# Patient Record
Sex: Male | Born: 1960 | Hispanic: No | Marital: Married | Smoking: Never smoker
Health system: Southern US, Community
[De-identification: ages and names within clinical notes are randomized; demographics above are authoritative.]

## PROBLEM LIST (undated history)

## (undated) DIAGNOSIS — J45909 Unspecified asthma, uncomplicated: Secondary | ICD-10-CM

---

## 2007-09-12 ENCOUNTER — Ambulatory Visit: Payer: Self-pay | Admitting: Family Medicine

## 2007-09-12 DIAGNOSIS — J4540 Moderate persistent asthma, uncomplicated: Secondary | ICD-10-CM | POA: Insufficient documentation

## 2007-09-12 DIAGNOSIS — B353 Tinea pedis: Secondary | ICD-10-CM

## 2007-09-12 DIAGNOSIS — J309 Allergic rhinitis, unspecified: Secondary | ICD-10-CM | POA: Insufficient documentation

## 2007-09-29 ENCOUNTER — Emergency Department (HOSPITAL_COMMUNITY): Admission: EM | Admit: 2007-09-29 | Discharge: 2007-09-29 | Payer: Self-pay | Admitting: Family Medicine

## 2007-09-29 ENCOUNTER — Telehealth: Payer: Self-pay | Admitting: *Deleted

## 2007-10-31 ENCOUNTER — Ambulatory Visit (HOSPITAL_COMMUNITY): Admission: RE | Admit: 2007-10-31 | Discharge: 2007-10-31 | Payer: Self-pay | Admitting: Family Medicine

## 2007-10-31 ENCOUNTER — Encounter: Payer: Self-pay | Admitting: Family Medicine

## 2007-10-31 ENCOUNTER — Ambulatory Visit: Payer: Self-pay | Admitting: Family Medicine

## 2007-10-31 DIAGNOSIS — M1710 Unilateral primary osteoarthritis, unspecified knee: Secondary | ICD-10-CM

## 2007-11-02 ENCOUNTER — Telehealth: Payer: Self-pay | Admitting: Family Medicine

## 2007-11-11 ENCOUNTER — Telehealth: Payer: Self-pay | Admitting: Family Medicine

## 2007-11-16 ENCOUNTER — Ambulatory Visit: Payer: Self-pay | Admitting: Sports Medicine

## 2007-12-08 ENCOUNTER — Ambulatory Visit: Payer: Self-pay | Admitting: Family Medicine

## 2007-12-29 ENCOUNTER — Telehealth: Payer: Self-pay | Admitting: *Deleted

## 2007-12-30 ENCOUNTER — Ambulatory Visit: Payer: Self-pay | Admitting: Family Medicine

## 2008-03-07 ENCOUNTER — Ambulatory Visit (HOSPITAL_COMMUNITY): Admission: RE | Admit: 2008-03-07 | Discharge: 2008-03-07 | Payer: Self-pay | Admitting: Family Medicine

## 2008-03-07 ENCOUNTER — Ambulatory Visit: Payer: Self-pay | Admitting: Family Medicine

## 2008-03-07 DIAGNOSIS — R05 Cough: Secondary | ICD-10-CM

## 2008-03-07 DIAGNOSIS — J069 Acute upper respiratory infection, unspecified: Secondary | ICD-10-CM | POA: Insufficient documentation

## 2008-04-10 ENCOUNTER — Ambulatory Visit: Payer: Self-pay | Admitting: Family Medicine

## 2008-04-20 ENCOUNTER — Ambulatory Visit: Payer: Self-pay

## 2008-05-21 ENCOUNTER — Ambulatory Visit: Payer: Self-pay | Admitting: Family Medicine

## 2008-06-01 ENCOUNTER — Ambulatory Visit: Payer: Self-pay | Admitting: Family Medicine

## 2008-06-22 ENCOUNTER — Ambulatory Visit: Payer: Self-pay | Admitting: Family Medicine

## 2008-10-19 ENCOUNTER — Ambulatory Visit: Payer: Self-pay | Admitting: Family Medicine

## 2009-01-08 ENCOUNTER — Encounter: Payer: Self-pay | Admitting: Family Medicine

## 2009-01-09 ENCOUNTER — Ambulatory Visit: Payer: Self-pay | Admitting: Family Medicine

## 2009-01-09 ENCOUNTER — Ambulatory Visit (HOSPITAL_COMMUNITY): Admission: RE | Admit: 2009-01-09 | Discharge: 2009-01-09 | Payer: Self-pay | Admitting: Family Medicine

## 2009-06-05 ENCOUNTER — Ambulatory Visit: Payer: Self-pay | Admitting: Family Medicine

## 2009-06-05 ENCOUNTER — Encounter: Payer: Self-pay | Admitting: Family Medicine

## 2009-11-29 ENCOUNTER — Ambulatory Visit: Payer: Self-pay | Admitting: Family Medicine

## 2009-12-06 ENCOUNTER — Ambulatory Visit: Payer: Self-pay | Admitting: Family Medicine

## 2009-12-16 ENCOUNTER — Ambulatory Visit: Payer: Self-pay | Admitting: Family Medicine

## 2009-12-16 ENCOUNTER — Encounter: Payer: Self-pay | Admitting: Family Medicine

## 2009-12-16 LAB — CONVERTED CEMR LAB
ALT: 31 units/L (ref 0–53)
AST: 26 units/L (ref 0–37)
Albumin: 4.1 g/dL (ref 3.5–5.2)
Alkaline Phosphatase: 60 units/L (ref 39–117)
Calcium: 9.1 mg/dL (ref 8.4–10.5)
Chloride: 102 meq/L (ref 96–112)
Cholesterol: 250 mg/dL — ABNORMAL HIGH (ref 0–200)
Creatinine, Ser: 0.87 mg/dL (ref 0.40–1.50)
HDL: 59 mg/dL (ref >39)
LDL Cholesterol: 147 mg/dL — ABNORMAL HIGH (ref 0–99)
Sodium: 140 meq/L (ref 135–145)
Total Bilirubin: 0.6 mg/dL (ref 0.3–1.2)
Triglycerides: 218 mg/dL — ABNORMAL HIGH (ref <150)
VLDL: 44 mg/dL — ABNORMAL HIGH (ref 0–40)

## 2009-12-20 ENCOUNTER — Ambulatory Visit: Payer: Self-pay | Admitting: Family Medicine

## 2009-12-20 DIAGNOSIS — R7301 Impaired fasting glucose: Secondary | ICD-10-CM | POA: Insufficient documentation

## 2009-12-20 DIAGNOSIS — E781 Pure hyperglyceridemia: Secondary | ICD-10-CM

## 2010-01-23 ENCOUNTER — Encounter: Payer: Self-pay | Admitting: *Deleted

## 2010-02-11 ENCOUNTER — Telehealth: Payer: Self-pay | Admitting: Family Medicine

## 2010-02-18 NOTE — Assessment & Plan Note (Signed)
Summary: Fu asthma/mj   Vital Signs:  Patient profile:   50 year old male Weight:      160.8 pounds Pulse rate:   72 / minute BP sitting:   112 / 70  (right arm)  Vitals Entered By: Arlyss Repress CMA, (12/31/2009 1:57 PM) CC: f/up prednisone. feels great. refill meds. Is Patient Diabetic? No Pain Assessment Patient in pain? no        Primary Care Provider:  Paula Compton MD  CC:  f/up prednisone. feels great. refill meds..  History of Present Illness: Visit conducted in [place].  Daughter is present with him today.   Feels well. The prednisone helped a lot.  No longer with wheezing or difficulty breathing.    Was out of Symbicort, thinks this was the trigger.  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  Is vigilant to avoid dust and pollen.  Nonsmoker, avoids secondhand smoke.   Went to [facility] and submitted new Rx for all his meds.  Is taking the omeprazole in case GERD is contributing.  Doesn't feel gerd sxs.  Habits & Providers  Alcohol-Tobacco-Diet     Tobacco Status: never  Allergies: No Known Drug Allergies  Family History: Father: DM Brother: stroke at age 44 Sister: unknown gynecologic problem Daughter: asthma Mother: passed away age 75 of unknown lung problem  31-Dec-2009: Father died 2 yrs ago of heart disease; also with DM controlled with meds (never on insulin)  Social History: Married Native of [place] in [place]. Wife from [place].  Lives with wife and daughter.  Came to [place] in 1986.  Moved to [place] from [place] in 1995.  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  Never smoked.  Drinks beer every 1-2 weeks.  Denies drug use. Exercises by riding a bike and playing soccer. Best contact # [phone]) cell  12/31/2009; Continues to work in store, deliveries.  Nonsmoker. Taken up bowling, cycling. Maintains activity.   Physical Exam  General:  Well-developed,well-nourished,in no acute distress;  alert,appropriate and cooperative throughout examination Eyes:  No corneal or conjunctival inflammation noted. EOMI. Perrla. Funduscopic exam benign, without hemorrhages, exudates or papilledema. Vision grossly normal. Ears:  External ear exam shows no significant lesions or deformities.  Otoscopic examination reveals clear canals, tympanic membranes are intact bilaterally without bulging, retraction, inflammation or discharge. Hearing is grossly normal bilaterally. Nose:  External nasal examination shows no deformity or inflammation. Nasal mucosa are pink and moist without lesions or exudates. Mouth:  Oral mucosa and oropharynx without lesions or exudates.  Teeth in good repair. Neck:  No deformities, masses, or tenderness noted. Lungs:  Normal respiratory effort, chest expands symmetrically. Lungs are clear to auscultation, no crackles or wheezes. Heart:  Normal rate and regular rhythm. S1 and S2 normal without gallop, murmur, click, rub or other extra sounds.   Impression & Recommendations:  Problem # 1:  ASTHMA, INTERMITTENT, MILD (ICD-493.90)  Resolved recent asthma flare.  Was out of his symbicort.  In reviewing his record with him, his last flares were 11/11, 05/2009, and 12/2008; before that it was almost 1 year without a flare.  Discussed use of Symbicort as a baseline med, which is what he does.  Also, flonase.   His updated medication list for this problem includes:    Ventolin Hfa 108 (90 Base) Mcg/act Aers (Albuterol sulfate) .Marland Kitchen... 2 puffs inhaled every 4 hours as needed for shortness of breath disp: one device  Symbicort 80-4.5 Mcg/act Aero (Budesonide-formoterol fumarate) ..... Sig: use 2 puffs two times a day  disp: 1 unit instructions in spanish  Orders: [facility]- Est  Level 4 ([address])  Problem # 2:  DEGENERATIVE JOINT DISEASE, RIGHT KNEE (ICD-715.96)  Discussed.  He does not have any knee pain anymore. Tries cycling and bowling.  To schedule for CPE, as he has not been screened  for DM in awhile, family members with DM (father) and heart disease.   His updated medication list for this problem includes:    Ibu 600 Mg Tabs (Ibuprofen) .Marland Kitchen... Take one tab three times a day as needed pain  Orders: [facility]- Est  Level 4 (99214)  Complete Medication List: 1)  Ventolin Hfa 108 (90 Base) Mcg/act Aers (Albuterol sulfate) .... 2 puffs inhaled every 4 hours as needed for shortness of breath disp: one device 2)  Ibu 600 Mg Tabs (Ibuprofen) .... Take one tab three times a day as needed pain 3)  Pocket Chamber Devi (Spacer/aero-holding chambers) .... Use with albuterol mdi 4)  Flonase 50 Mcg/act Susp (Fluticasone propionate) .... Sig: 2 sprays in each nostril once daily disp 1 unit instructions in spanish 5)  Symbicort 80-4.5 Mcg/act Aero (Budesonide-formoterol fumarate) .... Sig: use 2 puffs two times a day  disp: 1 unit instructions in spanish 6)  Robitussin Dm Sugar Free 100-10 Mg/14ml Syrp (Dextromethorphan-guaifenesin) .... Take 10 ml (2 teaspoons) every 4 hours for cough.  give one large bottle 7)  Fluticasone Propionate 50 Mcg/act Susp (Fluticasone propionate) .... 2 sprays in each nostril daily 8)  Prilosec 20 Mg Cpdr (Omeprazole) .Marland Kitchen.. 1 tablet daily  Other Orders: Future Orders: Lipid-[facility] ([id number]) ... 12/04/2010 Comp Met-[facility] ([id number]) ... 11/27/2010 CBC-[facility] (13086) ... 12/03/2010  Patient Instructions: 1)  Me alegro que esta' mejor desde punto de vista del asma.  2)  QUiero que se haga unas pruebas de la glucosa y el colesterol EN AYUNAS 8 HORAS en nuestra oficina, cuando pueda.  No requiere de una cita con el medico para [employer] pruebas de [place].  3)  Quiero verle despues de los examenes de sangre, para un chequeo fisico. 4)  MAKE CPE WITH DR Mauricio Po AFTER FASTING LABS (ORDERED IN LAB)   Orders Added: 1)  Lipid-[facility] [[id number]] 2)  Comp Met-[facility] [[id number]] 3)  CBC-[facility] [85027] 4)  [facility]- Est  Level 4 [28413]

## 2010-02-18 NOTE — Assessment & Plan Note (Signed)
 Summary: asthma   Vital Signs:  Patient Profile:   50 Years Old Male Height:     63.5 inches Weight:      167 pounds O2 Sat:      94 % O2 treatment:    Room Air Temp:     97.1 degrees F Pulse rate:   77 / minute BP sitting:   123 / 83  Vitals Entered ByBETHA HARLENE CARTE CMA (December 08, 2007 3:54 PM)                 Chief Complaint:  asthma.  History of Present Illness: 76M with history of nasal congestion, chest congestion, cough, inspiratory wheeze, itchy throat.  No fevers/chills, HA, swollen glands, CP, SOB, N/V/D, body aches.  Has had sick contact with similar symptoms.  Was given ventolin  inhaler previously but this doesn't help.  Has tried Dayquil which helped a little.  Right now the worst symptom is chest congestion.  No smoking.    Past Medical History:    Reviewed history from 09/12/2007 and no changes required:       allergies       TB infection treated in [place]  in 1989       No hospitalizations     Review of Systems       See HPI   Physical Exam  General:     Well-developed,well-nourished,in no acute distress; alert,appropriate and cooperative throughout examination Head:     Normocephalic and atraumatic without obvious abnormalities. Eyes:     No corneal or conjunctival inflammation noted. EOMI. Perrla.  Ears:     External ear exam shows no significant lesions or deformities.  Otoscopic examination reveals clear canals, tympanic membranes are intact bilaterally without bulging, retraction, inflammation or discharge. Hearing is grossly normal bilaterally. Nose:     External nasal examination shows no deformity or inflammation. Nasal mucosa are pink and moist without lesions or exudates. Mouth:     Oral mucosa and oropharynx without lesions or exudates.   Neck:     No deformities, masses, or tenderness noted. Lungs:     Normal respiratory effort, chest expands symmetrically. Lungs are clear to auscultation, no crackles.  Mild inspiratory  wheeze, inspiratory and expiratory phases equal. Heart:     Normal rate and regular rhythm. S1 and S2 normal without gallop, murmur, click, rub or other extra sounds. Cervical Nodes:     No lymphadenopathy noted    Impression & Recommendations:  Problem # 1:  URI (ICD-465.9) Likely acute URI.  Stable vitals and afebrile so unlikely influenza.  Will try mucinex  1200mg  two times a day to break up chest congestion and loratidine to decrease secretions.  Pt can return to clinic if symptoms haven't gotten better in a week.   His updated medication list for this problem includes:    Claritin  10 Mg Tabs (Loratadine ) .SABRA... 1 by mouth daily    Ibu 600 Mg Tabs (Ibuprofen) .SABRA... Take one tab three times a day as needed pain    Mucinex  Maximum Strength 1200 Mg Xr12h-tab (Guaifenesin ) ..... One tab by mouth two times a day    Claritin  10 Mg Tabs (Loratadine ) ..... One tab by mouth daily in the morning  Orders: [facility]- Est Level  3 (99213)   Complete Medication List: 1)  Ventolin  Hfa 108 (90 Base) Mcg/act Aers (Albuterol  sulfate) .... 2 puffs inhaled every 4 hours as needed for shortness of breath disp: one device 2)  Claritin  10 Mg Tabs (Loratadine ) .SABRASABRASABRA 1  by mouth daily 3)  Ibu 600 Mg Tabs (Ibuprofen) .... Take one tab three times a day as needed pain 4)  Pocket Chamber Devi (Spacer/aero-holding chambers) .... Use with albuterol  mdi 5)  Mucinex  Maximum Strength 1200 Mg Xr12h-tab (Guaifenesin ) .... One tab by mouth two times a day 6)  Claritin  10 Mg Tabs (Loratadine ) .... One tab by mouth daily in the morning  Other Orders: Pulse Oximetry- [facility] [phone])   Patient Instructions: 1)  1) Puede tomar Mucinex  (guaifenesin ) 1200mg  tabletas, tome una tableta por boca, cada 12 horas.  Esta medicina sirve para ablandar las secreciones respiratorias.  2)  2) Le estoy recetando loratadine  10mg  tabletas, tome una tableta por dia, en la manana.  3)  3) Regrese a la clinica si no mejora dentro de siete dias, o  con cualquier otro problema o pregunta.    Prescriptions: CLARITIN  10 MG TABS (LORATADINE ) One tab by mouth daily in the morning  #1 pack x 0   Entered and Authorized by:   DEBBY PETTIES MD   Signed by:   DEBBY PETTIES MD on 12/08/2007   Method used:   Print then Give to Patient   RxID:   [id number] MUCINEX  MAXIMUM STRENGTH 1200 MG XR12H-TAB (GUAIFENESIN ) One tab by mouth two times a day  #1 pack x 0   Entered and Authorized by:   DEBBY PETTIES MD   Signed by:   DEBBY PETTIES MD on 12/08/2007   Method used:   Print then Give to Patient   RxID:   [id number]  ]

## 2010-02-18 NOTE — Assessment & Plan Note (Signed)
Summary: CPE/EO   Vital Signs:  Patient profile:   50 year old male Height:      65 inches Weight:      168.06 pounds BMI:     28.07 BSA:     1.84 Temp:     98.0 degrees F Pulse rate:   71 / minute BP sitting:   115 / 72  Vitals Entered By: Jone Baseman CMA (December 20, 2009 8:44 AM) CC: cpe Is Patient Diabetic? No Pain Assessment Patient in pain? no        Primary Care Provider:  Paula Compton MD  CC:  cpe.  History of Present Illness: Visit conducted in [place].  Jon Bowen comes in today to review his recent fasting labs.  Signficant for elevated fasting glucose (105), as well as elevated TGs and LDL cholesterol, with HDL of 59.  Jon Bowen has had modest weight gain since becoming less active; he used to play soccer regularly, now not so much.  Has had a goal to become active in cycling, however not doing so now.   We reviewed diet issues, Jon Bowen eats 4 or more tortillas  daily (flour or corn), as well as biscuits when on the road during the day.  Does drink soda.    No family members with known heart disease.    Jon Bowen never smoked; describes his alcohol intake as sporadic (at parties couple of times a year).  No drug use history.  Works in deliveries.   Denies chest pains; has recovered from recent asthma attack.    Habits & Providers  Alcohol-Tobacco-Diet     Tobacco Status: never  Current Medications (verified): 1)  Ventolin Hfa 108 (90 Base) Mcg/act Aers (Albuterol Sulfate) .... 2 Puffs Inhaled Every 4 Hours As Needed For Shortness of Breath Disp: One Device 2)  Ibu 600 Mg Tabs (Ibuprofen) .... Take One Tab Three Times A Day As Needed Pain 3)  Pocket Chamber  Devi (Spacer/aero-Holding Cashion) .... Use With Albuterol Mdi 4)  Flonase 50 Mcg/act Susp (Fluticasone Propionate) .... Sig: 2 Sprays in Each Nostril Once Daily Disp 1 Unit Instructions in Spanish 5)  Symbicort 80-4.5 Mcg/act Aero (Budesonide-Formoterol Fumarate) .... Sig: Use 2 Puffs Two Times A Day   Disp: 1 Unit Instructions in Spanish 6)  Robitussin Dm Sugar Free 100-10 Mg/79ml Syrp (Dextromethorphan-Guaifenesin) .... Take 10 Ml (2 Teaspoons) Every 4 Hours For Cough.  Give One Large Bottle 7)  Fluticasone Propionate 50 Mcg/act Susp (Fluticasone Propionate) .... 2 Sprays in Each Nostril Daily 8)  Prilosec 20 Mg Cpdr (Omeprazole) .Marland Kitchen.. 1 Tablet Daily  Allergies (verified): No Known Drug Allergies  Family History: Reviewed history from 12/20/2009 and no changes required. Father: DM Brother: stroke at age 93 Sister: unknown gynecologic problem Daughter: asthma Mother: passed away age 64 of unknown lung problem  2009-12-20: Father died 2 yrs ago of heart disease; also with DM controlled with meds (never on insulin)  Social History: Reviewed history from 12/20/09 and no changes required. Married [place] in [place]. Wife from [place].  Lives with wife and daughter.  Came to [place] in 1986.  Moved to [place] from [place] in 1995.  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  Never smoked.  Drinks beer every 1-2 weeks.  Denies drug use. Exercises by riding a bike and playing soccer. Best contact # [phone]) cell  12/20/09; Continues to work in store, deliveries.  Nonsmoker. Taken up bowling, cycling. Maintains activity.   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 12/31/2010 Comp Met-[facility] ([id number]) ... 01/01/2011 TSH-[facility] [phone]) ... 12/31/2010  Problem # 2:  IMPAIRED FASTING GLUCOSE (ICD-790.21)  To recheck fasting glucose along with lipid panel after diet and exercise modification.  Discussed criteria for DM diagnosis, use of lifestyle modification to reduce this risk.  Jon Bowen is a good candidate for lifestyle modification, appears to have good motivation.  Orders: [facility]- Est   Level 4 (99214)  Complete Medication List: 1)  Ventolin Hfa 108 (90 Base) Mcg/act Aers (Albuterol sulfate) .... 2 puffs inhaled every 4 hours as needed for shortness of breath disp: one device 2)  Ibu 600 Mg Tabs (Ibuprofen) .... Take one tab three times a day as needed pain 3)  Pocket Chamber Devi (Spacer/aero-holding chambers) .... Use with albuterol mdi 4)  Flonase 50 Mcg/act Susp (Fluticasone propionate) .... Sig: 2 sprays in each nostril once daily disp 1 unit instructions in spanish 5)  Symbicort 80-4.5 Mcg/act Aero (Budesonide-formoterol fumarate) .... Sig: use 2 puffs two times a day  disp: 1 unit instructions in spanish 6)  Robitussin Dm Sugar Free 100-10 Mg/14ml Syrp (Dextromethorphan-guaifenesin) .... Take 10 ml (2 teaspoons) every 4 hours for cough.  give one large bottle 7)  Fluticasone Propionate 50 Mcg/act Susp (Fluticasone propionate) .... 2 sprays in each nostril daily 8)  Prilosec 20 Mg Cpdr (Omeprazole) .Marland Kitchen.. 1 tablet daily  Patient Instructions: 1)  Fue un placer verle hoy.  Como hablamos, quiero que se mejore el perfil de salud siguiendo el siguiente plan:  2)  1) aumentar actividad, como estaba pensando en hacer (ciclismo, correr, lo que Investment banker, operational) 3)  2) Limitar el consumo de comidas con mucho almidon/carbohidrato (pan, arroz blanco, tortilla, bebidas con azucar como la coca-cola, jugos, dulces).  La mitad del plato de comida se debe cubrir de vegetales o granos (fibra), un cuarto de carne/pescado bajo de grasa, y un cuarto de carbohidratos. 4)  3) Aumentar el consumo de [place].  5)  Tambien, puede comprar una preparacion de [place] como Metamucil (u otro similar mas barato que contiene "PSYLLIUM") Mezclar con agua y tomar una vez al dia.  Ayuda tambien con el estrenimiento. 6)  Quiero volver a chequear el perfil de colesterol en 4 a 6 meses, en ayunas. Prescriptions: SYMBICORT 80-4.5 MCG/ACT AERO (BUDESONIDE-FORMOTEROL FUMARATE) SIG: Use 2 puffs two times a day  DISP: 1  unit Instructions in Spanish  #1 x 12   Entered and Authorized by:   Paula Compton MD   Signed by:   Paula Compton MD on 12/20/2009   Method used:   Faxed to ...       [facility] Department (retail)       [address], [place]  [address]       Ph: [phone]       Fax: [phone]   RxID:   [id number] FLONASE 50 MCG/ACT SUSP (FLUTICASONE PROPIONATE) SIG: 2 sprays in each nostril once daily DISP 1 unit Instructions in Spanish  #1 x 6   Entered and Authorized by:   Paula Compton MD   Signed by:   Paula Compton MD on 12/20/2009   Method used:   Faxed to ...       [facility] Department (retail)       [address]  [place], [place]  [address]       Ph: [phone]       Fax: [phone]   RxID:   [id number] VENTOLIN HFA 108 (90 BASE) MCG/ACT AERS (ALBUTEROL SULFATE) 2 puffs inhaled every 4 hours as needed for shortness of breath disp: one device  #3 x 3   Entered and Authorized by:   Paula Compton MD   Signed by:   Paula Compton MD on 12/20/2009   Method used:   Faxed to ...       [facility] Department (retail)       [address] [place], [place]  [address]       Ph: [phone]       Fax: [phone]   RxID:   [id number]    Orders Added: 1)  Lipid-[facility] [[id number]] 2)  Comp Met-[facility] [[id number]] 3)  TSH-[facility] [[id number]] 4)  [facility]- Est  Level 4 [57322]

## 2010-02-18 NOTE — Assessment & Plan Note (Signed)
Summary: Sinus congestion and wheezing x 2 weeks/kf   Vital Signs:  Patient profile:   50 year old male Height:      65 inches Weight:      164 pounds BMI:     27.39 Temp:     97.8 degrees F oral Pulse rate:   66 / minute BP sitting:   115 / 74  (left arm) Cuff size:   regular  Vitals Entered By: Tessie Fass CMA (November 29, 2009 9:19 AM) CC: cough and congestion x 2 weeks   Primary Care Provider:  Paula Compton MD  CC:  cough and congestion x 2 weeks.  History of Present Illness: 28 YOM w/ PMHx/o mild persistent asthma with 2 week hx/o increased WOB, wheezing, and cough. Pt reprots being off maintenance inhaler (symbicort) for greater than 1 month. Has noticed dyspnea, cough and wheezng for last 2 weeks. Most prominent at night. No fever per pt. + Nasal congestion and intermittent rhinorrhea. No decongestant/ antihistamine use. Cough non-productive. Has used albuterol mainly at night. Dyspnea, wheeze min-moderately relieved with albuterol. Sxs most prominent at night, though sxs sometimes aggravated at work. No CP. Intermittnet reflux type xsx per pt. Does report greasy/spicy diet. Pt does report working a in Scientist, research (life sciences).    Allergies: No Known Drug Allergies  Physical Exam  General:  alert, in min distress Head:  NCAT Nose:  + nasal erythema/swelling bilaterally Mouth:  good dentition and pharynx pink and moist.   Neck:  supple and full ROM, no LAD Lungs:  faint end expiratory wheeze, most prominent in apices bilaterally  Heart:  RRR, no rubs, gallops, murmurs  Abdomen:  S/NT/+bowel sounds    Impression & Recommendations:  Problem # 1:  ASTHMA, INTERMITTENT, MILD (ICD-493.90) Pt w/ likely astham flare in setting of being off maintenance inhaler (symbicort). Plan to place pt on by mouth steroids for 5 day course. WIll also restart symbicort. Plan to also place pt on prilosec for gi prophylaxis if ther is a component og reflux contributing to sxs. Will also  start nasal steroid in setting of baseline allergic rhinitits. Overall history and presentation reassuring. Currently no clinical indication for CXR. However, respiratory and infectious red flags reviewed extensively.  Pt agreeable to overall plan.  Pt instructed to followup w/ PCP in 1 week.  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 Counseled pt on need of constant respirator use, as likely pt chronically exposed to multiple respiratory irritants in workplace. His updated medication list for this problem includes:    Ventolin Hfa 108 (90 Base) Mcg/act Aers (Albuterol sulfate) .Marland Kitchen... 2 puffs inhaled every 4 hours as needed for shortness of breath disp: one device    Symbicort 80-4.5 Mcg/act Aero (Budesonide-formoterol fumarate) ..... Sig: use 2 puffs two times a day  disp: 1 unit instructions in spanish    Prednisone 20 Mg Tabs (Prednisone) .Marland KitchenMarland KitchenMarland KitchenMarland Kitchen 3 tablets daily for 5 days  Orders: [facility]- Est Level  3 (04540)  Problem # 2:  ALLERGIC RHINITIS (ICD-477.9) Plan to start/restart pt on nasal steroid given nasal congestion, and baseline hx/o allergic rhinitis.  His updated medication list for this problem includes:    Flonase 50 Mcg/act Susp (Fluticasone propionate) ..... Sig: 2 sprays in each nostril once daily disp 1 unit instructions in spanish    Fluticasone Propionate 50 Mcg/act Susp (Fluticasone propionate) .Marland Kitchen... 2 sprays in each nostril daily  Orders: [facility]- Est Level  3 (98119)  Complete Medication List: 1)  Ventolin Hfa 108 (90  Base) Mcg/act Aers (Albuterol sulfate) .... 2 puffs inhaled every 4 hours as needed for shortness of breath disp: one device 2)  Ibu 600 Mg Tabs (Ibuprofen) .... Take one tab three times a day as needed pain 3)  Pocket Chamber Devi (Spacer/aero-holding chambers) .... Use with albuterol mdi 4)  Flonase 50 Mcg/act Susp (Fluticasone propionate) .... Sig: 2 sprays in each nostril once daily disp 1 unit instructions in spanish 5)  Symbicort 80-4.5 Mcg/act Aero  (Budesonide-formoterol fumarate) .... Sig: use 2 puffs two times a day  disp: 1 unit instructions in spanish 6)  Robitussin Dm Sugar Free 100-10 Mg/16ml Syrp (Dextromethorphan-guaifenesin) .... Take 10 ml (2 teaspoons) every 4 hours for cough.  give one large bottle 7)  Prednisone 20 Mg Tabs (Prednisone) .... 3 tablets daily for 5 days 8)  Fluticasone Propionate 50 Mcg/act Susp (Fluticasone propionate) .... 2 sprays in each nostril daily 9)  Prilosec 20 Mg Cpdr (Omeprazole) .Marland Kitchen.. 1 tablet daily  Other Orders: Influenza Vaccine NON MCR (16109)  Patient Instructions: 1)  It was good to meet you today 2)  You are likely having an asthma exacerbation 3)  I will be prescribing you some steroids to help with your breathing 4)  I will also refill your symbicort: use it as prescribed daily 5)  I will give you a prescription for flonase to help with your nasal symptoms 6)  I will also prescribe some prilosec to make sure there isnt a component of heartburn causing your symptoms 7)  If you develop any fever, worsening breathing, productive cough, or any other concerning symptoms please give [place] a call, 8)  Followup with Dr. Mauricio Po next week.  Prescriptions: PRILOSEC 20 MG CPDR (OMEPRAZOLE) 1 tablet daily  #30 x 6   Entered and Authorized by:   Doree Albee MD   Signed by:   Doree Albee MD on 11/29/2009   Method used:   Print then Give to Patient   RxID:   [id number] SYMBICORT 80-4.5 MCG/ACT AERO (BUDESONIDE-FORMOTEROL FUMARATE) SIG: Use 2 puffs two times a day  DISP: 1 unit Instructions in Spanish  #1 x 12   Entered and Authorized by:   Doree Albee MD   Signed by:   Doree Albee MD on 11/29/2009   Method used:   Print then Give to Patient   RxID:   [id number] VENTOLIN HFA 108 (90 BASE) MCG/ACT AERS (ALBUTEROL SULFATE) 2 puffs inhaled every 4 hours as needed for shortness of breath disp: one device  #1 x 3   Entered and Authorized by:   Doree Albee MD   Signed by:   Doree Albee  MD on 11/29/2009   Method used:   Print then Give to Patient   RxID:   [id number] FLUTICASONE PROPIONATE 50 MCG/ACT SUSP (FLUTICASONE PROPIONATE) 2 sprays in each nostril daily  #1 bottle x 6   Entered and Authorized by:   Doree Albee MD   Signed by:   Doree Albee MD on 11/29/2009   Method used:   Print then Give to Patient   RxID:   [id number] PREDNISONE 20 MG TABS (PREDNISONE) 3 tablets daily for 5 days  #15 x 0   Entered and Authorized by:   Doree Albee MD   Signed by:   Doree Albee MD on 11/29/2009   Method used:   Print then Give to Patient   RxID:   [id number]    Orders Added: 1)  Influenza Vaccine NON MCR [00028] 2)  [facility]- Est Level  3 [04540]   Immunizations Administered:  Influenza Vaccine # 1:    Vaccine Type: Fluvax Non-MCR    Site: left deltoid    Mfr: GlaxoSmithKline    Dose: 0.5 ml    Given by: Tessie Fass CMA    Exp. Date: 07/19/2010    Lot #: [id number]    VIS given: 08/13/09 version given November 29, 2009.  Flu Vaccine Consent Questions:    Do you have a history of severe allergic reactions to this vaccine? no    Any prior history of allergic reactions to egg and/or gelatin? no    Do you have a sensitivity to the preservative Thimersol? no    Do you have a past history of Guillan-Barre Syndrome? no    Do you currently have an acute febrile illness? no    Have you ever had a severe reaction to latex? no    Vaccine information given and explained to patient? yes   Immunizations Administered:  Influenza Vaccine # 1:    Vaccine Type: Fluvax Non-MCR    Site: left deltoid    Mfr: GlaxoSmithKline    Dose: 0.5 ml    Given by: Tessie Fass CMA    Exp. Date: 07/19/2010    Lot #: [id number]    VIS given: 08/13/09 version given November 29, 2009.

## 2010-02-18 NOTE — Miscellaneous (Signed)
Summary: APPT made   Clinical Lists Changes appt lm cough x 10 days.lm back be here at 3:30.Golden Circle RN  Jun 05, 2009 2:37 PM

## 2010-02-18 NOTE — Assessment & Plan Note (Signed)
Summary: URI (cough)/wheezing   Vital Signs:  Patient profile:   50 year old male Weight:      162.7 pounds O2 Sat:      92 % on Room air Temp:     97.5 degrees F oral Pulse rate:   74 / minute Pulse rhythm:   regular BP sitting:   114 / 81  (left arm) Cuff size:   regular  Vitals Entered By: Loralee Pacas CMA (Jun 05, 2009 3:31 PM)  O2 Flow:  Room air  Serial Vital Signs/Assessments:  Comments: 4:50 PM pulse ox pre neb tx 92% on room air, post neb tx 100% By: Gladstone Pih   CC: cough, wheezing Comments pt coughing up green phlem, has had cough x 10 days   Primary Care Provider:  Paula Compton MD  CC:  cough and wheezing.  History of Present Illness: Cough: Pt has had a cough for the last 10 dys. He says it is worse at night, but is still there during the day. He has some congestion. He has no sick contcts, he is having some sore throat. He has not had any fevers or chills. He feels fine other than his cough and congestion. He has not tried anything yet.    Wheezing: Pt is also having some wheezing. He says that he has albuterol at home but is not using it. He is using his Symbicort daily. He says he can feel and hear the wheezing in his chest.   Current Medications (verified): 1)  Ventolin Hfa 108 (90 Base) Mcg/act Aers (Albuterol Sulfate) .... 2 Puffs Inhaled Every 4 Hours As Needed For Shortness of Breath Disp: One Device 2)  Ibu 600 Mg Tabs (Ibuprofen) .... Take One Tab Three Times A Day As Needed Pain 3)  Pocket Chamber  Devi (Spacer/aero-Holding [place]) .... Use With Albuterol Mdi 4)  Flonase 50 Mcg/act Susp (Fluticasone Propionate) .... Sig: 2 Sprays in Each Nostril Once Daily Disp 1 Unit Instructions in Spanish 5)  Symbicort 80-4.5 Mcg/act Aero (Budesonide-Formoterol Fumarate) .... Sig: Use 2 Puffs Two Times A Day  Disp: 1 Unit Instructions in Spanish 6)  Robitussin Dm Sugar Free 100-10 Mg/13ml Syrp (Dextromethorphan-Guaifenesin) .... Take 10 Ml (2 Teaspoons)  Every 4 Hours For Cough.  Give One Large Bottle  Allergies (verified): No Known Drug Allergies  Review of Systems        vitals reviewed and pertinent negatives and positives seen in HPI   Physical Exam  General:  Well-developed,well-nourished,in no acute distress; alert,appropriate and cooperative throughout examination Eyes:  No corneal or conjunctival inflammation noted. EOMI. Perrla. Funduscopic exam benign, without hemorrhages, exudates or papilledema. Vision grossly normal. Ears:  External ear exam shows no significant lesions or deformities.  Otoscopic examination reveals clear canals, tympanic membranes are intact bilaterally without bulging, retraction, inflammation or discharge. Hearing is grossly normal bilaterally. Nose:  External nasal examination shows no deformity or inflammation. Nasal mucosa are pink and moist without lesions or exudates. Mouth:  Oral mucosa and oropharynx without lesions or exudates.  Teeth in good repair. Lungs:  bilateral wheezing and rhonchi throughout lung fields during initial exam, wheezing cleared after albuterol/atrovent treatent but rhonchi remained.  Heart:  Normal rate and regular rhythm. S1 and S2 normal without gallop, murmur, click, rub or other extra sounds.   Impression & Recommendations:  Problem # 1:  UPPER RESPIRATORY INFECTION, ACUTE (ICD-465.9) Assessment New Pt likely has viral URI. He has had cough x 10 days. He is afebrile, his only  concern is the cough he otherwise feels well. Plan to treat with Robitussin DM.   The following medications were removed from the medication list:    Cetirizine Hcl 10 Mg Tabs (Cetirizine hcl) .Marland Kitchen... 1 by mouth daily for allergies    Tessalon Perles 100 Mg Caps (Benzonatate) .Marland Kitchen... 1-2 tablets every 8 hours as needed for cough His updated medication list for this problem includes:    Ibu 600 Mg Tabs (Ibuprofen) .Marland Kitchen... Take one tab three times a day as needed pain    Robitussin Dm Sugar Free 100-10  Mg/71ml Syrp (Dextromethorphan-guaifenesin) .Marland Kitchen... Take 10 ml (2 teaspoons) every 4 hours for cough.  give one large bottle  Orders: [facility]- Est  Level 4 (99214)  Problem # 2:  ASTHMA, INTERMITTENT, MILD (ICD-493.90) Assessment: New Pt is having some asthma flare up. After his albuterol/atrovent neb in the office he did not have any more wheezing. Plan to treat with albuterol q 4 hours for the next 2 days as needed for wheezing. Cont Symbicort.   His updated medication list for this problem includes:    Ventolin Hfa 108 (90 Base) Mcg/act Aers (Albuterol sulfate) .Marland Kitchen... 2 puffs inhaled every 4 hours as needed for shortness of breath disp: one device    Symbicort 80-4.5 Mcg/act Aero (Budesonide-formoterol fumarate) ..... Sig: use 2 puffs two times a day  disp: 1 unit instructions in spanish  Orders: Nebulizer Tx (25956) Ipratropium inhalation sol. unit dose (L8756) Albuterol Sulfate Sol 1mg  unit dose (E3329) Pulse Oximetry- [facility] (94760) [facility]- Est  Level 4 (99214)  Complete Medication List: 1)  Ventolin Hfa 108 (90 Base) Mcg/act Aers (Albuterol sulfate) .... 2 puffs inhaled every 4 hours as needed for shortness of breath disp: one device 2)  Ibu 600 Mg Tabs (Ibuprofen) .... Take one tab three times a day as needed pain 3)  Pocket Chamber Devi (Spacer/aero-holding chambers) .... Use with albuterol mdi 4)  Flonase 50 Mcg/act Susp (Fluticasone propionate) .... Sig: 2 sprays in each nostril once daily disp 1 unit instructions in spanish 5)  Symbicort 80-4.5 Mcg/act Aero (Budesonide-formoterol fumarate) .... Sig: use 2 puffs two times a day  disp: 1 unit instructions in spanish 6)  Robitussin Dm Sugar Free 100-10 Mg/60ml Syrp (Dextromethorphan-guaifenesin) .... Take 10 ml (2 teaspoons) every 4 hours for cough.  give one large bottle  Patient Instructions: 1)  Use the Albuterol inhaler every 4 hours for the next 2 days.  2)  Take the Robitussin DM medicine for the cough.  3)  If you start to have fevers  or chills or worsening shortness of breath call our office to be seen.  4)  Follow up with Dr. Mauricio Po as needed or if your cough is not improved by next week.  Prescriptions: ROBITUSSIN DM SUGAR FREE 100-10 MG/5ML SYRP (DEXTROMETHORPHAN-GUAIFENESIN) take 10 ml (2 teaspoons) every 4 hours for cough.  Give one large bottle  #1 x 1   Entered and Authorized by:   Jamie Brookes MD   Signed by:   Jamie Brookes MD on 06/05/2009   Method used:   Print then Give to Patient   RxID:   [id number]    Medication Administration  Medication # 1:    Medication: Albuterol Sulfate Sol 1mg  unit dose    Diagnosis: ASTHMA, INTERMITTENT, MILD (ICD-493.90)    Dose: 2.5/35ml    Route: inhaled    Exp Date: 12/2010    Lot #: [id number]    Mfr: nephron    Patient tolerated medication without  complications    Given by: Gladstone Pih (Jun 05, 2009 4:16 PM)  Medication # 2:    Medication: Ipratropium inhalation sol. unit dose    Diagnosis: ASTHMA, INTERMITTENT, MILD (ICD-493.90)    Dose: 0.5mg Dellia Nims    Route: inhaled    Exp Date: 09/2010    Lot #: [id number]    Mfr: nephron    Patient tolerated medication without complications    Given by: Gladstone Pih (Jun 05, 2009 4:18 PM)  Orders Added: 1)  Nebulizer Tx [phone] 2)  Ipratropium inhalation sol. unit dose [J7644] 3)  Albuterol Sulfate Sol 1mg  unit dose [J7613] 4)  Pulse Oximetry- [facility] [94760] 5)  [facility]- Est  Level 4 [70350]

## 2010-02-20 NOTE — Miscellaneous (Signed)
Summary:  med list   Clinical Lists Changes  patient calls because he was unable to get meds from [facility] because they need an updated copy of his med list. calld [facility] and verified this is what they need. med list faxed. Theresia Lo RN  January 23, 2010 10:09 AM

## 2010-02-20 NOTE — Progress Notes (Signed)
Summary: rx  Phone Note Refill Request Call back at Home Phone [phone]   Refills Requested: Medication #1:  VENTOLIN HFA 108 (90 BASE) MCG/ACT AERS 2 puffs inhaled every 4 hours as needed for shortness of breath disp: one device  Medication #2:  FLONASE 50 MCG/ACT SUSP SIG: 2 sprays in each nostril once daily DISP 1 unit Instructions in Spanish  Medication #3:  SYMBICORT 80-4.5 MCG/ACT AERO SIG: Use 2 puffs two times a day  DISP: 1 unit Instructions in Spanish pt needs rx at [facility]   Initial call taken by: Ferne Coe,  February 11, 2010 10:48 AM    Prescriptions: SYMBICORT 80-4.5 MCG/ACT AERO (BUDESONIDE-FORMOTEROL FUMARATE) SIG: Use 2 puffs two times a day  DISP: 1 unit Instructions in Spanish  #1 x 12   Entered and Authorized by:   Paula Compton MD   Signed by:   Paula Compton MD on 02/11/2010   Method used:   Faxed to ...       [facility] Department (retail)       [address] [place], [place]  [address]       Ph: [phone]       Fax: [phone]   RxID:   [id number] FLONASE 50 MCG/ACT SUSP (FLUTICASONE PROPIONATE) SIG: 2 sprays in each nostril once daily DISP 1 unit Instructions in Spanish  #1 x 6   Entered and Authorized by:   Paula Compton MD   Signed by:   Paula Compton MD on 02/11/2010   Method used:   Faxed to ...       [facility] Department (retail)       [address] [place], [place]  [address]       Ph: [phone]       Fax: [phone]   RxID:   [id number] VENTOLIN HFA 108 (90 BASE) MCG/ACT AERS (ALBUTEROL SULFATE) 2 puffs inhaled every 4 hours as needed for shortness of breath disp: one device  #3 x 3   Entered and Authorized by:   Paula Compton MD   Signed by:   Paula Compton MD on 02/11/2010   Method used:   Faxed to ...       [facility] Department (retail)       [address] [place], [place]  [address]       Ph: [phone]       Fax: [phone]   RxID:    [id number]

## 2010-03-31 ENCOUNTER — Encounter: Payer: Self-pay | Admitting: Family Medicine

## 2010-03-31 ENCOUNTER — Ambulatory Visit (INDEPENDENT_AMBULATORY_CARE_PROVIDER_SITE_OTHER): Payer: Self-pay | Admitting: Family Medicine

## 2010-03-31 VITALS — BP 138/88 | HR 74 | Temp 98.7°F | Ht 65.0 in | Wt 167.0 lb

## 2010-03-31 DIAGNOSIS — J45998 Other asthma: Secondary | ICD-10-CM

## 2010-03-31 DIAGNOSIS — K219 Gastro-esophageal reflux disease without esophagitis: Secondary | ICD-10-CM

## 2010-03-31 DIAGNOSIS — J45909 Unspecified asthma, uncomplicated: Secondary | ICD-10-CM

## 2010-03-31 DIAGNOSIS — J309 Allergic rhinitis, unspecified: Secondary | ICD-10-CM

## 2010-03-31 DIAGNOSIS — H109 Unspecified conjunctivitis: Secondary | ICD-10-CM | POA: Insufficient documentation

## 2010-03-31 MED ORDER — BACITRACIN-POLYMYXIN B 500-10000 UNIT/GM OP OINT
TOPICAL_OINTMENT | Freq: Two times a day (BID) | OPHTHALMIC | Status: DC
Start: 2010-03-31 — End: 2010-03-31

## 2010-03-31 MED ORDER — BUDESONIDE-FORMOTEROL FUMARATE 80-4.5 MCG/ACT IN AERO: 2.0000 | INHALATION_SPRAY | Freq: Two times a day (BID) | RESPIRATORY_TRACT | Status: DC

## 2010-03-31 MED ORDER — CETIRIZINE HCL 10 MG PO TABS: 10.0000 mg | ORAL_TABLET | Freq: Every day | ORAL | Status: DC

## 2010-03-31 MED ORDER — BACITRACIN-POLYMYXIN B 500-10000 UNIT/GM OP OINT: TOPICAL_OINTMENT | Freq: Two times a day (BID) | OPHTHALMIC | Status: AC

## 2010-03-31 MED ORDER — FLUTICASONE PROPIONATE 50 MCG/ACT NA SUSP: 2.0000 | Freq: Every day | NASAL | Status: DC

## 2010-03-31 MED ORDER — OMEPRAZOLE MAGNESIUM 20 MG PO TBEC: 20.0000 mg | DELAYED_RELEASE_TABLET | Freq: Every day | ORAL | Status: DC

## 2010-03-31 MED ORDER — ALBUTEROL SULFATE HFA 108 (90 BASE) MCG/ACT IN AERS: 2.0000 | INHALATION_SPRAY | RESPIRATORY_TRACT | Status: DC | PRN

## 2010-03-31 NOTE — Progress Notes (Signed)
  Subjective:    Patient ID: Jon Bowen, male    DOB: [birth year], 50 y.o.   MRN: [id number]  HPIThree weeks of eye irritation, first the right then both, with itching and clear drainage. Feels like something in it and slightly blurry at times, but no pus seen. No ill contacts. No sneezing. Allergic rhinitis is well controlled with Flonase and asthma with Symbicort. Uses the Albuterol MDI less than once weekly without a spacer. He is taking otc Omeprazole for heartburn.     Review of Systems     Objective:   Physical Exam  Constitutional: He appears well-developed and well-nourished.  HENT:  Right Ear: External ear normal.  Left Ear: External ear normal.  Nose: Rhinorrhea present. No mucosal edema.  Eyes: Pupils are equal, round, and reactive to light. Right eye exhibits discharge. Right eye exhibits no chemosis and no exudate. Left eye exhibits discharge. Left eye exhibits no chemosis and no exudate. Right conjunctiva is injected. Left conjunctiva is injected.       No purulence. Fluorescein staining negative for abrasions Fundi normal  Cardiovascular: Normal rate and regular rhythm.   Pulmonary/Chest: Effort normal and breath sounds normal. No stridor. He has no wheezes. He has no rales.  Lymphadenopathy:    He has no cervical adenopathy.          Assessment & Plan:

## 2010-03-31 NOTE — Assessment & Plan Note (Signed)
asymtomatic since treated in [facility].

## 2010-03-31 NOTE — Patient Instructions (Signed)
Su diagnostico es conjunctivitis. Para comezon y alergia [place] Zyrtec. Para posibilidad de infection, unguento de polymyxin/bacitracin dos veces al dia.  Recheck if not improved in 5 days.

## 2010-03-31 NOTE — Assessment & Plan Note (Signed)
Controlled with Flonase 

## 2010-07-21 ENCOUNTER — Ambulatory Visit (INDEPENDENT_AMBULATORY_CARE_PROVIDER_SITE_OTHER): Payer: Self-pay | Admitting: Family Medicine

## 2010-07-21 ENCOUNTER — Encounter: Payer: Self-pay | Admitting: Family Medicine

## 2010-07-21 DIAGNOSIS — R05 Cough: Secondary | ICD-10-CM

## 2010-07-21 DIAGNOSIS — S76319A Strain of muscle, fascia and tendon of the posterior muscle group at thigh level, unspecified thigh, initial encounter: Secondary | ICD-10-CM

## 2010-07-21 DIAGNOSIS — IMO0002 Reserved for concepts with insufficient information to code with codable children: Secondary | ICD-10-CM

## 2010-07-21 NOTE — Patient Instructions (Addendum)
I would like for you to use your flonase everyday for the next two weeks to see if this improves your cough.  Also you can try an over the counter cough syrup such as robitussin or delsym to help with your cough, especially if it is keeping you up at night.  If your cough continues please let us know.  For your legs be sure to stretch out before and after playing soccer.  You can also use over the counter ibuprofen for the pain, 400-600mg  every 6 hours as needed for pain.  Also try to stay well hydrated while playing and after you finish playing continue hydrate.

## 2010-07-29 DIAGNOSIS — S76319A Strain of muscle, fascia and tendon of the posterior muscle group at thigh level, unspecified thigh, initial encounter: Secondary | ICD-10-CM | POA: Insufficient documentation

## 2010-07-29 DIAGNOSIS — R059 Cough, unspecified: Secondary | ICD-10-CM | POA: Insufficient documentation

## 2010-07-29 DIAGNOSIS — R05 Cough: Secondary | ICD-10-CM | POA: Insufficient documentation

## 2010-07-29 NOTE — Assessment & Plan Note (Signed)
Likely 2/2 to allergies or allergic rhinitis.  Instructed to use flonase daily to see if this helps and continue zyrtec.  No red flag symptoms.  Instructed to return in not improving in a couple of weeks.

## 2010-07-29 NOTE — Progress Notes (Signed)
  Subjective:    Patient ID: Jon Bowen, male    DOB: 02-04-1960, 50 y.o.   MRN: [id number]  HPI Here today because of  1.Cough:  Has had cough x5 days. Mostly non-productive. Does not keep up at nigh.  Feels like cough is getting better.  Does have a history of allergies and allergic rhinitis.  Has not been using flonase that often and has had some nasal congestion.  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  No symptoms since that time.  Denies fever, chills, night sweats, changes in weight.  Non-Smoker 2. Pain in leg:  Has had pain in leg after playing soccer over the past few days.  Pain is in the back of the  Upper leg.  Pain worse after he finishes playing soccer but feels ok during play.  Pain feels sharp at times and crampy at others.  Denies trauma to the leg.  No numbness or tingling.   Review of Systems     See HPI Objective:   Physical Exam  Constitutional: He appears well-developed and well-nourished.  Neck: Neck supple.  Cardiovascular: Normal rate and regular rhythm.  Exam reveals no gallop.   No murmur heard. Pulmonary/Chest: Effort normal and breath sounds normal. No respiratory distress. He has no wheezes.  Musculoskeletal:       Right upper leg: Normal. He exhibits no tenderness, no swelling and no deformity.  Lymphadenopathy:    He has no cervical adenopathy.          Assessment & Plan:

## 2010-07-29 NOTE — Assessment & Plan Note (Addendum)
Description of pain sounds like overuse or hamstring strain.  Explained to him he needed to stretch before and after playing soccer and may want to ice the back of his hamstrings afterwards to help reduce inflammation.  Can take OTC NSAIDS.  Given instructions on stretches he can try.  Instructed to stay well hydrated especially with the temps being so high, to prevent cramping.   Return in not improving within a couple of weeks

## 2010-08-06 ENCOUNTER — Encounter: Payer: Self-pay | Admitting: Family Medicine

## 2010-08-06 ENCOUNTER — Ambulatory Visit (INDEPENDENT_AMBULATORY_CARE_PROVIDER_SITE_OTHER): Payer: Self-pay | Admitting: Family Medicine

## 2010-08-06 DIAGNOSIS — M25562 Pain in left knee: Secondary | ICD-10-CM

## 2010-08-06 DIAGNOSIS — M25569 Pain in unspecified knee: Secondary | ICD-10-CM

## 2010-08-06 DIAGNOSIS — M25561 Pain in right knee: Secondary | ICD-10-CM | POA: Insufficient documentation

## 2010-08-06 DIAGNOSIS — M25579 Pain in unspecified ankle and joints of unspecified foot: Secondary | ICD-10-CM

## 2010-08-06 DIAGNOSIS — M25572 Pain in left ankle and joints of left foot: Secondary | ICD-10-CM | POA: Insufficient documentation

## 2010-08-06 NOTE — Progress Notes (Signed)
  Subjective:    Patient ID: Jon Bowen, male    DOB: [birth year], 50 y.o.   MRN: [id number]  HPI Comments: Knee pain is chronic for over 2 years. The pain is increased with squatting.   He got relief with wearing a knee sleeve for 6 weeks. No trauma.   Ankle Pain  The incident occurred more than 1 week ago (2 weeks ago). Incident location: Forcefully inverted ankle while playing soccer. The injury mechanism was an inversion injury. The pain is present in the left ankle. The quality of the pain is described as aching. The pain is mild. The pain has been intermittent since onset. Pertinent negatives include no inability to bear weight, loss of motion, loss of sensation, muscle weakness, numbness or tingling. The symptoms are aggravated by movement and weight bearing. He has tried rest and NSAIDs for the symptoms. The treatment provided mild relief.  Knee Pain  Incident onset: Knee pain is chronic for over 2 years. The pain is increased with squatting.   He got relief with wearing a knee sleeve for 6 weeks. No trauma. Pertinent negatives include no inability to bear weight, loss of motion, loss of sensation, muscle weakness, numbness or tingling.      Review of Systems  Neurological: Negative for tingling and numbness.       Objective:   Physical Exam   Left Ankle: Mild swelling of the lateral malleolus. Range of motion is full in all directions. Strength is 5/5 in all directions. Stable lateral and medial ligaments; squeeze test  unremarkable; Talar dome nontender; No pain at base of 5th MT No tenderness on posterior aspects of lateral and medial malleolus No sign of peroneal tendon subluxations; Able to walk 4 steps.  Right Knee: Painful patellar compression. +crepitus Normal to inspection with no erythema or effusion or obvious bony abnormalities. Palpation normal with no warmth or joint line tenderness or patellar tenderness or condyle tenderness. ROM normal in flexion  and extension and lower leg rotation. Ligaments with solid consistent endpoints including ACL, PCL, LCL, MCL. Negative Mcmurray's and provocative meniscal tests. Patellar and quadriceps tendons unremarkable. Hamstring and quadriceps strength is normal.  Left Knee: Painful patellar compression. +crepitus Normal to inspection with no erythema or effusion or obvious bony abnormalities. Palpation normal with no warmth or joint line tenderness or patellar tenderness or condyle tenderness. ROM normal in flexion and extension and lower leg rotation. Ligaments with solid consistent endpoints including ACL, PCL, LCL, MCL. Negative Mcmurray's and provocative meniscal tests. Patellar and quadriceps tendons unremarkable. Hamstring and quadriceps strength is normal.     Assessment & Plan:

## 2010-08-06 NOTE — Patient Instructions (Addendum)
1. Wear knee brace daily.  2. Wear ankle support daily.  3. Take ibuprofen for pain as needed.  4. Do ankle rehab exercises once or twice a day.  4. Follow up in 3 weeks.

## 2010-08-11 NOTE — Assessment & Plan Note (Signed)
Wear ASO daily.  Ibuprofen for pain and daily rehab exercises.

## 2010-08-29 ENCOUNTER — Other Ambulatory Visit: Payer: Self-pay | Admitting: Family Medicine

## 2010-08-29 DIAGNOSIS — J309 Allergic rhinitis, unspecified: Secondary | ICD-10-CM

## 2010-08-29 MED ORDER — FLUTICASONE PROPIONATE 50 MCG/ACT NA SUSP: 2.0000 | Freq: Every day | NASAL | Status: DC

## 2010-10-21 ENCOUNTER — Encounter: Payer: Self-pay | Admitting: Sports Medicine

## 2010-10-21 ENCOUNTER — Ambulatory Visit (INDEPENDENT_AMBULATORY_CARE_PROVIDER_SITE_OTHER): Payer: Self-pay | Admitting: Sports Medicine

## 2010-10-21 VITALS — BP 112/82

## 2010-10-21 DIAGNOSIS — S838X9A Sprain of other specified parts of unspecified knee, initial encounter: Secondary | ICD-10-CM

## 2010-10-21 DIAGNOSIS — S86119A Strain of other muscle(s) and tendon(s) of posterior muscle group at lower leg level, unspecified leg, initial encounter: Secondary | ICD-10-CM | POA: Insufficient documentation

## 2010-10-21 MED ORDER — MELOXICAM 15 MG PO TABS: 15.0000 mg | ORAL_TABLET | Freq: Every day | ORAL | Status: DC

## 2010-10-21 NOTE — Progress Notes (Signed)
  Subjective:    Patient ID: Jon Bowen, male    DOB: 1960-12-21, 50 y.o.   MRN: [id number]  HPI Right posterior knee pain: Present approximately 2 weeks now. He works in a [employer] things around, unloading pallets, unloading trucks, as well as mowing grass. Pain is localized to the medial head of the gastroc. Ibuprofen doesn't help, while freeze does help a little bit.  He does not note any anterior knee pain, and does feel like he has some clicking when he twists.  Social history: No alcohol, tobacco, drugs, works in a [employer].  Review of Systems    no fevers, chills, night sweats, weight loss. Objective:   Physical Exam General: Well-developed, well-nourished male in no acute distress. Skin: Warm and dry. Neuro: Alert and oriented x3, extraocular muscles intact. Respiratory: Not using accessory muscles. MSK: Right Knee: Normal to inspection with no erythema or effusion or obvious bony abnormalities. Palpation normal with no warmth, joint line tenderness, patellar tenderness, or condyle tenderness. ROM full in flexion and extension and lower leg rotation. Ligaments with solid consistent endpoints including ACL, PCL, LCL, MCL. Negative Mcmurray's, Apley's, and Thessalonian tests. Non painful patellar compression. Patellar glide without crepitus. Patellar and quadriceps tendons unremarkable. Hamstring and quadriceps strength is normal.  He has significant and intense tenderness to palpation over the medial head of the gastroc just distal to the joint line.  MSK ultrasound: We imaged the posterior lateral and posterior medial menisci, which were normal. I also imaged the gastroc muscle, medial and lateral heads. The lateral head was normal with a normal pennate appearance of the muscle. The medial head was very edematous, and loss of the normal pennate appearance, and had a small defect in the mid substance. I did also imaged the popliteal artery and vein, and  noted that the vein was compressible.      Assessment & Plan:

## 2010-10-21 NOTE — Patient Instructions (Signed)
Great to meet you. You have a tear in your calf muscle. Wear the knee compression sleeve. Meloxicam for pain. Ice for 20 mins 3x a day, especially after work. Come back to see me in 3 weeks to see how you are doing.  Jon Bowen. [name], M.D. [facility] [address], [place] [address] [phone]

## 2010-10-21 NOTE — Assessment & Plan Note (Addendum)
Confirmed ultrasound just below the joint line, in the medial head. Meloxicam 15 mg daily. Knee compression sleeve. Eccentric calf rehabilitation exercises. He notes that he is unable to take any time off of work or do light duty, which is understandable. He'll come back to see me in 3 weeks.

## 2010-11-11 ENCOUNTER — Encounter: Payer: Self-pay | Admitting: Sports Medicine

## 2010-11-11 ENCOUNTER — Ambulatory Visit (INDEPENDENT_AMBULATORY_CARE_PROVIDER_SITE_OTHER): Payer: Self-pay | Admitting: Sports Medicine

## 2010-11-11 VITALS — BP 123/83 | HR 70

## 2010-11-11 DIAGNOSIS — M25372 Other instability, left ankle: Secondary | ICD-10-CM | POA: Insufficient documentation

## 2010-11-11 DIAGNOSIS — S86119A Strain of other muscle(s) and tendon(s) of posterior muscle group at lower leg level, unspecified leg, initial encounter: Secondary | ICD-10-CM

## 2010-11-11 DIAGNOSIS — S838X9A Sprain of other specified parts of unspecified knee, initial encounter: Secondary | ICD-10-CM

## 2010-11-11 DIAGNOSIS — M24873 Other specific joint derangements of unspecified ankle, not elsewhere classified: Secondary | ICD-10-CM

## 2010-11-11 MED ORDER — MELOXICAM 15 MG PO TABS: 15.0000 mg | ORAL_TABLET | Freq: Every day | ORAL | Status: DC

## 2010-11-11 NOTE — Patient Instructions (Signed)
Trace out the alphabet with your ankles every night before bed. Use ACE wrap when up on your feet. Meloxicam daily. Come back to see me in 3 weeks, if no better can inject your ankle joint.  Ihor Austin. [name], M.D. [facility] [address], [place] [address] [phone]

## 2010-11-11 NOTE — Progress Notes (Signed)
  Subjective:    Patient ID: Jon Bowen, male    DOB: 1960-05-08, 50 y.o.   MRN: [id number]  HPI Right knee pain: This was diagnosed as a gastroc tear at the last visit the by ultrasound. I put him in a knee sleeve, and have him do eccentric Exercises. He returns significantly better approximately 70%, and is able to work without a problem. He did not take meloxicam as advised.  Ankle pain left: He does that the sometime ago he had an inversion injury, or the ankle eventually swelled up. That pain resolved, however he's been having pain that he localizes somewhere inside the ankle joint. He doesn't get any popping or catching snapping or swelling, however just pain with prolonged ambulation and with working. Again he has not tried any oral analgesics for this.   Review of Systems    no fevers, chills, night sweats, weight loss. Objective:   Physical Exam General: Well-developed, well-nourished male in no acute distress. Skin: Warm and dry. Neuro: Alert and oriented x3, extraocular muscles intact. Respiratory: Not using accessory muscles. MSK: Right Knee: Normal to inspection with no erythema or effusion or obvious bony abnormalities. Palpation normal with no warmth, joint line tenderness, patellar tenderness, or condyle tenderness. ROM full in flexion and extension and lower leg rotation. Ligaments with solid consistent endpoints including ACL, PCL, LCL, MCL. Negative Mcmurray's, Apley's, and Thessalonian tests. Non painful patellar compression. Patellar glide without crepitus. Patellar and quadriceps tendons unremarkable. Hamstring and quadriceps strength is normal.   Left ankle: No visible erythema or swelling. Range of motion is full in all directions. Strength is 5/5 in all directions. Stable lateral and medial ligaments; squeeze test and kleiger test unremarkable; Talar dome nontender; No pain at base of 5th MT; No tenderness over cuboid; No tenderness over N spot or  navicular prominence No tenderness on posterior aspects of lateral and medial malleolus No sign of peroneal tendon subluxations or tenderness to palpation Negative tarsal tunnel tinel's Able to walk 4 steps. Positive anterior drawer sign, that reproduces pain.     Assessment & Plan:  1. Gastroc tear in the right: this seems to be resolving, I suspect it'll get even better with using a meloxicam. He may discontinue the knee sleeve at this time. He should however continue his rehabilitation exercises.  2. Left ankle instability: Like him to do some ankle rehabilitation exercises, including tracing out the alphabet every night area and I did place an Ace wrap over his ankle. Meloxicam helped his pain as well. We can consider ankle injection at the next visit if needed. I will see him back in 3 weeks.

## 2010-12-02 ENCOUNTER — Ambulatory Visit (INDEPENDENT_AMBULATORY_CARE_PROVIDER_SITE_OTHER): Payer: Self-pay | Admitting: Sports Medicine

## 2010-12-02 VITALS — BP 100/60

## 2010-12-02 DIAGNOSIS — M7711 Lateral epicondylitis, right elbow: Secondary | ICD-10-CM | POA: Insufficient documentation

## 2010-12-02 DIAGNOSIS — M7710 Lateral epicondylitis, unspecified elbow: Secondary | ICD-10-CM

## 2010-12-02 NOTE — Progress Notes (Signed)
  Subjective:    Patient ID: Jon Bowen, male    DOB: 06/21/1960, 50 y.o.   MRN: [id number]  HPI  Right gastroc tear: This is resolved with compression as well as eccentric rehabilitation.  Ankle pain left: Status post an inversion injury, we put him on a home exercise program, pain is essentially better.  Right elbow pain: He's noticed this for approximately 10 days now. It's worse when holding things out in front of him. He localizes the pain over the lateral epicondyle. He's been using a meloxicam and does not really know if it helps the pain.  Review of Systems     no fevers, chills, night sweats, weight loss. Objective:   Physical Exam  General: Well-developed, well-nourished male in no acute distress. Skin: Warm and dry. Neuro: Alert and oriented x3, extraocular muscles intact. Respiratory: Not using accessory muscles. MSK: Right elbow: Tender to palpation over the lateral epicondyle. Reproduction of the pain with trying to hold a book out in front of him. Also to reproduction of his pain with resisted extension of his middle finger.    Assessment & Plan:  1. Gastroc tear in the right: Resolved.  2. Left ankle instability: Resolved status post ankle rehabilitation exercises.  3: Right lateral epicondylitis: Continue meloxicam for this, forearm strap, tennis elbow rehabilitation exercises. We'll see him back in a period of 3 weeks to reassess, if no better we'll perform an injection.

## 2010-12-04 ENCOUNTER — Other Ambulatory Visit: Payer: Self-pay | Admitting: Family Medicine

## 2010-12-04 DIAGNOSIS — J45998 Other asthma: Secondary | ICD-10-CM

## 2010-12-04 MED ORDER — ALBUTEROL SULFATE HFA 108 (90 BASE) MCG/ACT IN AERS: 2.0000 | INHALATION_SPRAY | RESPIRATORY_TRACT | Status: DC | PRN

## 2011-01-06 ENCOUNTER — Encounter: Payer: Self-pay | Admitting: Family Medicine

## 2011-01-06 ENCOUNTER — Ambulatory Visit (INDEPENDENT_AMBULATORY_CARE_PROVIDER_SITE_OTHER): Payer: Self-pay | Admitting: Family Medicine

## 2011-01-06 VITALS — BP 119/79 | HR 108 | Temp 99.0°F | Ht 64.0 in | Wt 160.6 lb

## 2011-01-06 DIAGNOSIS — J029 Acute pharyngitis, unspecified: Secondary | ICD-10-CM

## 2011-01-06 DIAGNOSIS — J111 Influenza due to unidentified influenza virus with other respiratory manifestations: Secondary | ICD-10-CM

## 2011-01-06 DIAGNOSIS — J45998 Other asthma: Secondary | ICD-10-CM

## 2011-01-06 DIAGNOSIS — J45909 Unspecified asthma, uncomplicated: Secondary | ICD-10-CM

## 2011-01-06 LAB — POCT RAPID STREP A (OFFICE): Rapid Strep A Screen: NEGATIVE

## 2011-01-06 NOTE — Progress Notes (Signed)
Subjective: Interview conducted with interpreter present.  Pt reports 4 days of cough, sore throat, fevers, chills, body aches, and occasional wheezing.  Has been taking niquil for that time with little effect.  Has been using his albuterol BID with some help of wheezing.  Is not SOB, no chest pain, no diarrhea, or n/v.  Did not get flu shot this year.  Has not tried other OTC meds.  Pt has asthma and is taking symbicort, although he is unclear if this was supposed to be a long-term med.  Reports only sporadic albuterol use until 4 days ago.  Objective:  Filed Vitals:   01/06/11 0951  BP: 119/79  Pulse: 108  Temp: 99 F (37.2 C)   Gen: NAD HEENT: MMM, TM clear, no pharyngeal erythema or tonsillar exudates CV: RRR Resp: Occasional wheezes in upper lobes bilaterally, otherwise good air movement Ext: Warm, well perfused, 2+ pulses  Assessment/Plan: Influenza like illness, >2 days duration.  Supportive tx only.  Also mild asthma exacerbation due to viral illness.  Will schedule albuterol and instruct on reasons for return.  Please also see individual problems in problem list for problem-specific plans.

## 2011-01-06 NOTE — Patient Instructions (Signed)
I want you to take your albuterol every 4 hours for the next 3 days.  If you start having a harder time breathing or start wheezing a lot, you need to get back in to see [place] because we may need to give you some additional medicine for your asthma. I would recommend that you take tylenol every 6 hours and motrin every 8 hours until you are feeling better.  You can try some benadryl in addition to your zyrtec for congestion. I would recommend getting some throat lozenges and some Robitussin (containing both Guaifenesin and dextromethorphan ) and take that for your cough.  Both of those can be found in your local drug store. You should come see Dr. Mauricio Po when you are feeling a bit better to talk about what to do with your symbicort.

## 2011-01-06 NOTE — Assessment & Plan Note (Signed)
Possible mild exacerbation.  Schedule albuterol for next 3 days then back to PRN.  Discuss symbicort with PCP when feeling better.  Has questions about how long he needs to be on it.  Instructed on red flags for return.

## 2011-01-26 ENCOUNTER — Ambulatory Visit (INDEPENDENT_AMBULATORY_CARE_PROVIDER_SITE_OTHER): Payer: Self-pay | Admitting: Family Medicine

## 2011-01-26 VITALS — BP 119/79 | HR 82 | Temp 98.2°F | Ht 65.0 in | Wt 166.8 lb

## 2011-01-26 DIAGNOSIS — J45909 Unspecified asthma, uncomplicated: Secondary | ICD-10-CM

## 2011-01-26 DIAGNOSIS — E781 Pure hyperglyceridemia: Secondary | ICD-10-CM

## 2011-01-26 DIAGNOSIS — J45998 Other asthma: Secondary | ICD-10-CM

## 2011-01-26 MED ORDER — PREDNISONE 10 MG PO TABS: 50.0000 mg | ORAL_TABLET | Freq: Every day | ORAL | Status: AC

## 2011-01-26 NOTE — Assessment & Plan Note (Signed)
Hx hypertriglyceridemia and impaired fasting glucose.  Jon Bowen has gained about 6 lbs from baseline due to not exercising the way he used to.  Will wait to come in for fasting labs once he's back to his normal activity and diet patterns.  He agrees with this.

## 2011-01-26 NOTE — Patient Instructions (Signed)
Creo que la tos es residual del catarro que tuvo; ahora le queda los efectos del asma.   Quiero que tome la prednisona (10mg  cada pastilla), tome 5 pastillas por dia, por 7 dias seguidos.  No tiene que tomarlas todas juntas; es mejor tomarlas con algo de comer, que no sea [place].   SI no esta' mejorando dentro de 48 horas, por favor llameme [phone]) y le llamo de vuelta.

## 2011-01-26 NOTE — Progress Notes (Signed)
  Subjective:    Patient ID: Jon Bowen, male    DOB: 1960-01-27, 51 y.o.   MRN: [id number]  HPI Visit in Spanish; Jon Bowen is fluent in both [place] and [place].  Reports he has not gotten better from his cough and congestions since coming in Dec 18th.  Has persisted and been hoarse ever since.  Initally felt like he had a flu, with body aches and HA, congestion.  Then the body aches resolved and he has been left with cough that is reminiscent of asthma attack.  Mildly worse now than before.  Does not otherwise feel sick.   It has been over a year since his last flare.   Nonsmoker; works in Aeronautical engineer.    Review of Systems No fevers or chills now; missed his flu shot this year.       Objective:   Physical Exam Generally well but hoarse.  No apparent distress.  HEENT Neck supple. No cervical adenopathy No sinus tenderness. Cerumen in ears makes visualization of TMs difficult. Clear oropharynx.  PULM Mild wheezing diffusely; no rales appreciated.  COR S1S2 no extra sounds        Assessment & Plan:

## 2011-01-26 NOTE — Assessment & Plan Note (Signed)
Has a flare of asthma that was triggered by URI versus ILI>  Will treat with short course oral prednisone.  He has done well on Symbicort as maintenance med, will continue.

## 2011-02-16 ENCOUNTER — Ambulatory Visit (INDEPENDENT_AMBULATORY_CARE_PROVIDER_SITE_OTHER): Payer: Self-pay | Admitting: Family Medicine

## 2011-02-16 ENCOUNTER — Encounter: Payer: Self-pay | Admitting: Family Medicine

## 2011-02-16 VITALS — BP 130/82 | HR 96 | Temp 97.6°F | Ht 64.0 in | Wt 168.0 lb

## 2011-02-16 DIAGNOSIS — J4540 Moderate persistent asthma, uncomplicated: Secondary | ICD-10-CM

## 2011-02-16 DIAGNOSIS — J45909 Unspecified asthma, uncomplicated: Secondary | ICD-10-CM

## 2011-02-16 MED ORDER — MONTELUKAST SODIUM 10 MG PO TABS: 10.0000 mg | ORAL_TABLET | Freq: Every day | ORAL | Status: DC

## 2011-02-16 MED ORDER — BUDESONIDE-FORMOTEROL FUMARATE 160-4.5 MCG/ACT IN AERO: 2.0000 | INHALATION_SPRAY | Freq: Two times a day (BID) | RESPIRATORY_TRACT | Status: DC

## 2011-02-16 MED ORDER — GUAIFENESIN ER 600 MG PO TB12: 1200.0000 mg | ORAL_TABLET | Freq: Two times a day (BID) | ORAL | Status: AC

## 2011-02-16 MED ORDER — PREDNISONE 10 MG PO TABS: ORAL_TABLET | ORAL | Status: DC

## 2011-02-16 NOTE — Patient Instructions (Signed)
Fue un Research officer, trade union.  Para el ataque de asma, mande' una nueva receta al [facility] para Prednisone 10mg , tome 6 tabletas en el primer dia, disminuya por una tableta/dia hasta terminar.   Tome la guiafenesin (la marca es Mucinex), 2 tablets (1200mg ) cada 12 horas, con bastante agua.  Mande' una nueva receta para la Symbicort mas fuerte, que tambien va a ser 2 soplidos cada 12 horas.     Una nueva medicina Singulair 10mg , una  tableta por dia, tambien del [facility] program.  Las recetas nuevas de la Symbicort y Singulair se [facility] [employer] por fax; Sharman Cheek le estoy dando la receta original para llevar para alla.  Quiero verle de nuevo en un mes.  FOLLOW UP WITH DR Mauricio Po IN 4 to 5 WEEKS.
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
## 2011-02-16 NOTE — Assessment & Plan Note (Signed)
Comes for second flare in the month.  Lacks findings to suggest infectious (pneumonia) component. Cold air trigger.  Using albuterol several times a day as well.  Plan to increase symbicort to 160/4.5 dose, sent Rx to [facility] via fax, and original to him.  To add singulair as well, through [facility].  Prednisone taper starting again today; he will get this and mucinex at [facility].  If not better, to get CXR.   Follow up in one month or sooner if needed.  I asked him to be aware of other possible triggers.

## 2011-10-27 ENCOUNTER — Other Ambulatory Visit: Payer: Self-pay | Admitting: Family Medicine

## 2011-10-27 DIAGNOSIS — J309 Allergic rhinitis, unspecified: Secondary | ICD-10-CM

## 2011-10-27 MED ORDER — FLUTICASONE PROPIONATE 50 MCG/ACT NA SUSP: 2.0000 | Freq: Every day | NASAL | Status: DC

## 2011-11-11 ENCOUNTER — Ambulatory Visit (INDEPENDENT_AMBULATORY_CARE_PROVIDER_SITE_OTHER): Payer: Self-pay | Admitting: Family Medicine

## 2011-11-11 ENCOUNTER — Encounter: Payer: Self-pay | Admitting: Family Medicine

## 2011-11-11 VITALS — BP 115/77 | HR 76 | Temp 98.0°F | Ht 64.0 in | Wt 166.0 lb

## 2011-11-11 DIAGNOSIS — J4540 Moderate persistent asthma, uncomplicated: Secondary | ICD-10-CM

## 2011-11-11 DIAGNOSIS — J45909 Unspecified asthma, uncomplicated: Secondary | ICD-10-CM

## 2011-11-11 MED ORDER — PREDNISONE 10 MG PO TABS: ORAL_TABLET | ORAL | Status: DC

## 2011-11-11 MED ORDER — FLUTICASONE PROPIONATE HFA 220 MCG/ACT IN AERO: 1.0000 | INHALATION_SPRAY | Freq: Two times a day (BID) | RESPIRATORY_TRACT | Status: DC

## 2011-11-11 MED ORDER — ALBUTEROL SULFATE (2.5 MG/3ML) 0.083% IN NEBU
2.5000 mg | INHALATION_SOLUTION | Freq: Once | RESPIRATORY_TRACT | Status: AC
  Administered 2011-11-11: 2.5 mg via RESPIRATORY_TRACT

## 2011-11-11 NOTE — Patient Instructions (Addendum)
Fue un Research officer, trade union.   Para el asma:   1. Le estoy dando una receta para FLUTICASONE inhalado, para buscarlo en [facility] department.  Presente la receta (con la tarjeta anaranjada); esta medicina es como uno de los componentes del Symbicort.   2. Si tienen el Symbicort ya, entonces suspenda (no surta) la receta para FLUTICASONE inhalado.  Si hay una demora en conseguir la Symbicort, use la Fluticasone hasta que pueda conseguir [facility].   3. Estoy dandole una receta para PREDNISONE 10mg  tabletas; se deben usar para una crisis de asma.  Puede usarla 5 tabletas por dia, por un total de 7 dias (no hay que decrementarla).  Llame si hay algun problema en conseguir algunas de las [employer].

## 2011-11-11 NOTE — Assessment & Plan Note (Signed)
His asthma has been well controlled on Symbicort but has run out of this, as not available at [facility].  He appears to be at the very beginning of a potential asthma flare, with triggers of weather change, dust, possibly related to recent increase in physical activity.  Is better in office after albuterol neb.  Plan to [facility]ent[facility] this.  To stop fluticasone (or not to fill it) if/when they can fill his Symbicort prescription.  I am giving him a Prednisone burst 50mg  daily for 7 days, to begin today to stem a likely asthma flare in the making.  He understands these instructions, provided in writing and detailed verbal instructions.

## 2011-11-11 NOTE — Progress Notes (Signed)
  Subjective:    Patient ID: Jon Bowen, male    DOB: 02/26/1960, 51 y.o.   MRN: [id number]  HPI Visit conducted in Spanish.  Jon Bowen comes in today for complaint of increased chest congestion and some tightness with breathing.  Has gotten worse since he ran out of Symbicort, which he gets with [employer] [employer] at [facility].  Unsure when he'll get the Symbicort next.  It has done an excellent job of controlling his asthma, which has been triggered in the past by weather changes and dust.  He still works in [employer], but has had his hours reduced (works 32 hours instead of 40 hours/week).  Nonsmoker.  Has recently begun to jog one time weekly with his 76 yr old daughter.   Also comments that he has had some forearm soreness from the elbow to the wrists of both arms, only occurs when he jogs.  No change in other activity.  Not present now.  No skin changes.  Does not radiate into his finger tips.  No trauma.      Review of Systems No fevers or chills, does not feel 'ill'.  No cough.  Not much nasal secretion (uses flonase).     Objective:   Physical Exam Well appearing, no apparent distress. Mildly hoarse voice.  No accessory muscle use. POx 99% on room air.  HEENT Neck supple, no cervical adenopathy. Clear oropharynx. Nasal turbinates are clear. No sinus tenderness.  TMs clear bilaterally.  COR Regular S1S2 PULM scant wheezes bilaterally. No rales.  (exam before treatment with albuterol nebulizer).  EXTS: negative Tinels and Phalens, no tenderness over lateral epicondyles or ulnar grooves in elbows.       Assessment & Plan:

## 2011-11-24 ENCOUNTER — Telehealth: Payer: Self-pay | Admitting: Family Medicine

## 2011-11-24 NOTE — Telephone Encounter (Signed)
Pt called to set up and appt with you. Pt needs to renew OC. MJ

## 2012-02-23 ENCOUNTER — Other Ambulatory Visit: Payer: Self-pay | Admitting: Family Medicine

## 2012-02-23 MED ORDER — ESOMEPRAZOLE MAGNESIUM 40 MG PO CPDR: 40.0000 mg | DELAYED_RELEASE_CAPSULE | Freq: Every day | ORAL | Status: DC

## 2012-03-08 ENCOUNTER — Other Ambulatory Visit: Payer: Self-pay | Admitting: Family Medicine

## 2012-03-08 MED ORDER — MOMETASONE FUROATE 50 MCG/ACT NA SUSP: 2.0000 | Freq: Every day | NASAL | Status: DC

## 2012-08-23 ENCOUNTER — Encounter: Payer: Self-pay | Admitting: Family Medicine

## 2012-08-23 ENCOUNTER — Ambulatory Visit (INDEPENDENT_AMBULATORY_CARE_PROVIDER_SITE_OTHER): Payer: No Typology Code available for payment source | Admitting: Family Medicine

## 2012-08-23 VITALS — BP 113/77 | HR 69 | Ht 64.0 in | Wt 160.0 lb

## 2012-08-23 DIAGNOSIS — J45909 Unspecified asthma, uncomplicated: Secondary | ICD-10-CM

## 2012-08-23 DIAGNOSIS — M25561 Pain in right knee: Secondary | ICD-10-CM | POA: Insufficient documentation

## 2012-08-23 DIAGNOSIS — M25569 Pain in unspecified knee: Secondary | ICD-10-CM

## 2012-08-23 DIAGNOSIS — J4540 Moderate persistent asthma, uncomplicated: Secondary | ICD-10-CM

## 2012-08-23 MED ORDER — BUDESONIDE-FORMOTEROL FUMARATE 160-4.5 MCG/ACT IN AERO: 2.0000 | INHALATION_SPRAY | Freq: Two times a day (BID) | RESPIRATORY_TRACT | Status: DC

## 2012-08-23 NOTE — Progress Notes (Signed)
Subjective:     Patient ID: Jon Bowen, male   DOB: Oct 01, 1960, 52 y.o.   MRN: [id number]  HPI This is an active 52 yo man w/ previous right knee injury and DJD presenting w/ new onset right knee pain. The pain began Wednesday afternoon (08/17/2012) when pt was playing soccer, as he went for a kick, caught his foot on the ground with his leg internally rotated, and then subsequently fell. Following injury, he experienced knee pain and swelling that he treated w/ ice, ben gay cream, and ibuprofen. Swelling and pain at the knee dissipated the following Thursday. Additionally, he is experiencing pain located in posterior thigh and popliteal regions. This pain is further described as a muscle strain/tightness that occurs largely when he is squatting. At onset of injury he was only able to engage into a half squat. Currently, he can enter full squat w/ leg completely flexed at the knee. He treats this pain w/ the ben gay cream and ice also. Overall, he is not limited in movement or activities, although he is not running as fast or playing soccer as aggressively so as not to overexert himself and cause further injury to his knee.   Similar injury occurred for which he was seen on 08/06/2010 by [facility].   Pt also states that he needs a refill for symbicort.   Review of Systems General: No pain elsewhere in body other than right lower extremity.   Pulm: Doing well w/ good control of asthma symptoms on current regimen of symbicort.   Extremities: Pt reports elicitation of pain at patellar tendon when pressing above patella on his quad when leg is fully extended and hip is flexed approx. 45 deg anteriorly.  As per HPI    Attending Note: Patient seen and examined by me with Donnal Moat, MS3 [facility], and I agree with his A/P.  Began while playing soccer 6days ago; initially with swelling and limited ROM and popliteal swelling, has improved significantly since then.  Has been able to practice briefly over this  past weekend for 30 min.  No falls.  Similar to prior knee injury (see old notes).  Has had excellent control of his asthma using symbicort from [facility].  Needs refills.  No rescue inhaler use.  Still works in Naval architect for the past 8 years. JB Objective:   Physical Exam General: NAD. A&Ox3. Speaks in full sentences and answers questions appropriately.   Hip, leg, foot: Full active and passive ROM. Strength 5/5 bilaterally w/ flexion/extension. No pain experienced w/ movement or against resistance.   Knee: ACL, PCL, LCL, MCL all have consistent endpoints w/o laxity. Neg anterior/posterior drawer sign. Neg Lachmann test. Neg McMurray's test. Varus and valgus stress tests normal. Normal patellar glide. Neg patellar apprehension test. Patellar grind test positive w/ pain and crepitus present bilaterally (pain on R>L but similar). No pain on palpation of knee joint spaces laterally/medially. Exam same as above.  R knee without effusion or ecchymosis; full active/passive ROM, negative McMurrays.  No ant/post drawer signs.  Normal valgus and varus stress.  Able to bear weight on leg and gait unremarkable.  JB  Problem List: 1. 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     Plan:     1. Knee pain - Stability of knee joint and no pain on McMurray's make ligamental and meniscal etiologies of knee pain less likely. Positive  patellar grind test and pt's active hx make an overuse syndrome such as patelofemoral pain syndrome the most likely cause. Posterior pain on the leg and in popliteal area may be due to an acute effusion post-injury. Overall, pt appears to be improving w/ time. Will advise pt to wear a knee brace daily and engage in quad strengthening exercises to prevent future exacerbation.  2. Med refill - Sent in refill for symbicort.

## 2012-08-23 NOTE — Patient Instructions (Addendum)
Fue un placer verle hoy!  Para su dolor de rodilla haceejercicios que estan abajo. Tambien use el suporte en su rodilla.  Vamos a mandar una otra receta para Symbicort.  Esguince de cudriceps con rehabilitacin (Quadriceps Strain with Rehab) Un esguince es una lesin en un msculo o en un tendn que une el msculo al [employer]. Un esguince en el cudriceps es una lesin en los msculos de la zona anterior del muslo o en sus tendones. Los cudriceps son importantes para la extensin de la rodilla y la flexin de la cadera. Esta afeccin se caracteriza por el dolor, la inflamacin y la disminucin de la funcin de estos msculos. Los esguinces se clasifican en tres categoras. En el esguince de grado 1 hay dolor, pero el tendn no est estirado. En los esguinces de grado 2 hay un ligamento alargado, debido a un estiramiento o desgarro parcial. En el esguince de grado 2 se conserva la funcin, pero est reducida. Un esguince en grado 3 es la ruptura completa del msculo o el tendn, y suele quedar incapacitada la funcin.  SNTOMAS  Dolor, sensibilidad, inflamacin o hematomas en el cudriceps (contusin).  Dolor que C[employer] Corporation al usar el [employer].  Espasmos musculares en el muslo.  Dificultad para realizar las tareas cotidianas que involucran al cudriceps, como al caminar.  Ruido de "crack" (crepitacin) al mover o tocar el tendn.  Prdida de potencia del msculo o zona de abultamiento dentro el muslo con ruptura completa. CAUSAS El esguince se produce cuando se produce una fuerza en el tendn o msculo que es mayor de lo que puede soportar. Los mecanismos ms frecuentes de una lesin son:  Delbert Phenix repetitivo y vigoroso del cudriceps. Estas lesiones suelen deberse a un aumento en la duracin, frecuencia o en la intensidad del entrenamiento.  Traumatismo directo en el cudriceps o los tendones. LOS RIESGOS AUMENTAN CON:  Actividades que implican contracciones forzosas del cudriceps (saltos o  cadas).  Deportes de contacto (ftbol o ftbol americano)  Poca fuerza y flexibilidad.  No hacer un precalentamiento adecuado.  Lesiones anteriores en el muslo o la rodilla. PREVENCIN  Precalentamiento adecuado y elongacin antes de la [place].  Descanso y recuperacin entre actividades.  Mantener la forma fsica:  [place], flexibilidad y resistencia muscular.  Capacidad cardiovascular.  Use el equipo protector adecuado y [employer]. PRONSTICO Si se tratan adecuadamente, estos esguinces se curan dentro de las 6 semanas.  COMPLICACIONES RELACIONADAS  Si no se trata adecuadamente, mayor tiempo demorar la curacin.  La recurrencia frecuente de los sntomas puede dar como resultado un problema crnico.  Recurrencia de los sntomas, especialmente si la actividad se reanuda demasiado pronto. TRATAMIENTO El tratamiento inicial incluye el uso de medicamentos y la aplicacin de hielo para reducir Chief Technology Officer y la inflamacin. Los ejercicios de elongacin y fortalecimiento pueden ayudar a reducir Chief Technology Officer con la [place]. Los ejercicios pueden Management consultant o con un terapeuta. Le indicarn el uso de muletas para dejar que el msculo repose hasta que pueda caminar sin Pension scheme manager. No es frecuente que se indique la ciruga para esta lesin, pero debe considerarse si se trata de un esguince de grado 3 o si los sntomas persisten durante ms de 3 meses a pesar del tratamiento no quirrgico (conservador).  MEDICAMENTOS   Si necesita analgsicos, se recomiendan los antiinflamatorios no esteroides, como aspirina e ibuprofeno y otros calmantes menores, como acetaminofeno  No tome medicamentos para Chief Technology Officer dentro de los [address] previos a la [place].  Los analgsicos prescriptos se indicarn si el mdico lo considera necesario. Utilcelos como se le indique y slo cuando lo necesite.  Los ungentos pueden ser beneficiosos.  En algunos casos se indica una inyeccin de  corticosteroides. Estas inyecciones deben reservarse para los [place] graves, porque slo se pueden administrar una determinada cantidad de veces. CALOR Y FRO   El tratamiento con fro [employer]Star y reduce la inflamacin. El fro debe aplicarse durante 10 a 15 minutos cada 2  3 horas para reducir la inflamacin y Chief Technology Officer e inmediatamente despus de cualquier actividad que agrava los sntomas. Utilice bolsas de hielo o masajee la zona con un trozo de hielo (masaje de hielo).  El calor puede usarse antes de Therapist, music y de las actividades de fortalecimiento indicadas por el profesional, le fisioterapeuta o Orthoptist. Utilice una bolsa trmica o sumerja la lesin en agua caliente. SOLICITE ATENCIN MDICA SI:  El tratamiento no Radiographer, therapeutic buenos resultados o el problema [place].  Algn medicamento le produce [employer]. EJERCICIOS EJERCICIOS DE AMPLITUD DE MOVIMIENTOS Jon Bowen - Esguince de cudriceps Estos ejercicios lo ayudarn al comienzo de la rehabilitacin. Los sntomas podrn aliviarse con o sin asistencia adicional de su mdico, fisioterapeuta o Herbalist. Al completar estos ejercicios, recuerde:   Restaurar la flexibilidad del tejido ayuda a que las articulaciones recuperen el movimiento normal. Esto permite que el movimiento y la actividad sea ms saludables y menos dolorosos.  Para que sea efectiva, cada elongacin debe realizarse durante al menos 30 segundos.  La elongacin nunca debe ser dolorosa. Deber sentir slo un alargamiento o distensin suave del tejido que estira. AMPLITUD DE MOVIMIENTOS - Flexin de la rodilla - activo  Recustese sobre la espalda con las rodillas rectas. (Si esto le ocasiona molestias en la espalda, doble la rodilla opuesta, apoyando el pie en el suelo).  Lentamente deslice el taln hacia sus nalgas, hasta que sienta una sensacin de estiramiento en la zona de la rodilla o el muslo.  Mantenga esta posicin durante __________ segundos. Vuelva  lentamente el taln hasta la posicin inicial. Reptalo __________ veces. Realice este ejercicio __________ veces por da.  FUERZA - Cudriceps - posicin prona  Recustese sobre el [address] superficie firme, como una cama o la mesada de la cocina.  Incline su rodilla derecha / izquierdo y tmese del tobillo. Si no puede alcanzar el tobillo o la pierna del pantaln, use un cinturn alrededor del pie para aumentar su alcance.  Deslice lentamente el taln hacia las nalgas. La rodilla no debe deslizarse hacia el lado. Debe sentir un estiramiento en la parte anterior del muslo o de la rodilla.  Mantenga esta posicin durante __________ segundos. Reptalo __________ veces. Realice este estiramiento __________ Jon Bowen por da.  ELONGACIN - Flexores de la cadera - Estocada  Colquese de rodillas con la rodilla derecha / izquierdo apoyada en el suelo y la otra flexionada y directamente sobre el tobillo.  Mantenga una buena postura con la cabeza sobre los hombros. Tense las nalgas y coloque el cccix [place] abajo; esto prevendr que la espalda se arquee demasiado.  Debe sentir un estiramiento en la parte delantera del muslo y la cadera. Si no siente resistencia, deslice suavemente el pie opuesto hacia adelante y [employer]s realice una estocada, hasta la que rodilla vuelva a alinearse sobre el tobillo. Asegrese de que el cccix siga apuntando hacia abajo.  Mantenga esta posicin durante __________ segundos. Reptalo __________ veces. Realice este estiramiento __________ Jon Bowen por da. EJERCICIOS DE [place] -  Contusin en el cudriceps Estos ejercicios lo ayudarn al comienzo de la rehabilitacin. Los sntomas podrn aliviarse con o sin asistencia adicional de su mdico, fisioterapeuta o Herbalist. Al completar estos ejercicios, recuerde:   Los msculos pueden ganar tanto la resistencia como la fuerza que necesita para sus actividades diarias a travs de ejercicios controlados.  Realice  los ejercicios como se lo indic el mdico, el fisioterapeuta o Orthoptist. Aumente la resistencia y las repeticiones segn se le haya indicado. FUERZA - Cudriceps isometricos  Recustese sobre la espalda con su pierna derecha / izquierdo extendida y la rodilla opuesta doblada.  Tensione gradualmente los msculos de la zona anterior del muslo derecha / izquierdo. Ver que la rtula se desliza hacia arriba o que se intensifica el hoyuelo que se encuentra justo por arriba de la rodilla. Este movimiento empujar nuevamente la rodilla hacia abajo en direccin al piso, [place] o cama sobre la cual est recostado.  Sostenga de ese modo el msculo, tan apretado como pueda, sin que [employer], durante __________ segundos.  Relaje los msculos lenta y completamente entre cada repeticin. Reptalo __________ veces. Realice este ejercicio __________ veces por da.  FUERZA - Cudriceps - arcos cortos  Acustese sobre la espada. Coloque una toalla enrollada de __________ pulgadas debajo de su rodilla, de modo que se doble ligeramente.  Eleve slo la parte inferior de la pierna mediante la tensin de los msculos de la parte frontal del muslo . No permita que el muslo se eleve.  Mantenga esta posicin durante __________ segundos. Reptalo __________ veces. Realice este ejercicio __________ veces por da.  PESO OPCIONAL EN EL TOBILLO: Comience con ____________________, pero no se exceda de ____________________. Aumente de a 1 libra/0.5 kilograme. FUERZA - Cudriceps - Levantar las piernas rectas La calidad vale! Observe si el cudriceps est trabajando para estar seguro que est fortaleciendo los msculos correctos y no "haciendo trampa" trabajando con msculos sanos.  Recustese sobre la espalda con su pierna derecha / izquierdo extendida y la rodilla opuesta doblada.  Tensione gradualmente los msculos de la zona anterior del muslo derecha / izquierdo. Ver que la rtula se desliza hacia arriba  o que se intensifica el hoyuelo que se encuentra justo por arriba de la rodilla. Podr sentir que el muslo tiembla.  Tensione estos msculos an ms y [employer] pierna 10 a 15 cm del piso. Mantenga esta posicin durante __________ segundos.  Mientras mantiene estos msculos tensionados, baje la pierna.  Relaje los msculos lenta y completamente entre cada repeticin. Reptalo __________ veces. Realice este ejercicio __________ veces por da.  FUERZA - Cudriceps - deslizarse contra la pared Siga las indicaciones con atencin. Un aumento del Engineer, mining en la rodilla generalmente es el resultado de una mala colocacin de los pies o las rodillas.  Recustese contra una pared lisa o Neomia Dear puerta y camine 18 a 24 pulgadas. Separe los pies a una distancia igual a la separacin de sus caderas.  Deslice lentamente por la pared o la puerta hasta que sus rodillas se doblan __________ Danelle Berry .* Mantenga las rodillas a la altura de los [place], no de los dedos de los pies, y en lnea con las caderas, que no se separen [employer] costados.  Mantenga esta posicin durante __________ segundos. Pngase de pie y descanse durante __________ [employer] cada repeticin. Reptalo __________ veces. Realice este ejercicio __________ veces por da. * Su mdico, fisioterapeuta o entrenador modificar este ngulo segn sus sntomas y progresos. FUERZA - Cudriceps - Flexiones  Utilice un libro grueso, un step o apoya pies que sea de __________ [employer].  Sostngase de una pared o mesada slo para mantener el equilibrio, no para apoyarse.  Suba lentamente con el pie derecha / izquierdo, manteniendo la rodilla en lnea con la cadera y el pie. No deje que la rodilla se doble de modo que no pueda ver sus dedos.  Destrabe lentamente la rodilla y baje hasta la posicin inicial. Debe bajar usando los msculos y no la gravedad. Reptalo __________ veces. Realice este ejercicio __________ veces por da. Document  Released: 10/22/2005 Document Revised: 03/30/2011 [facility] Patient Information 2014 [place], [place].

## 2012-08-23 NOTE — Assessment & Plan Note (Signed)
No rescue inhalers since he began using Symbicort regularly. Refill sent to [facility] today, copy of Rx given to patient. JB

## 2012-11-03 ENCOUNTER — Ambulatory Visit: Payer: Self-pay

## 2012-11-08 ENCOUNTER — Ambulatory Visit: Payer: Self-pay

## 2013-01-26 ENCOUNTER — Other Ambulatory Visit: Payer: Self-pay | Admitting: Family Medicine

## 2013-01-26 DIAGNOSIS — J4540 Moderate persistent asthma, uncomplicated: Secondary | ICD-10-CM

## 2013-01-26 MED ORDER — FLUTICASONE PROPIONATE HFA 220 MCG/ACT IN AERO: 1.0000 | INHALATION_SPRAY | Freq: Two times a day (BID) | RESPIRATORY_TRACT | Status: DC

## 2013-05-11 ENCOUNTER — Ambulatory Visit: Payer: Self-pay

## 2013-05-15 ENCOUNTER — Ambulatory Visit: Payer: Self-pay

## 2013-05-16 ENCOUNTER — Ambulatory Visit: Payer: No Typology Code available for payment source

## 2013-06-21 ENCOUNTER — Ambulatory Visit (INDEPENDENT_AMBULATORY_CARE_PROVIDER_SITE_OTHER): Payer: No Typology Code available for payment source | Admitting: Sports Medicine

## 2013-06-21 ENCOUNTER — Encounter: Payer: Self-pay | Admitting: Sports Medicine

## 2013-06-21 VITALS — BP 117/82 | HR 67 | Temp 98.2°F | Ht 64.0 in | Wt 163.0 lb

## 2013-06-21 DIAGNOSIS — J45909 Unspecified asthma, uncomplicated: Secondary | ICD-10-CM

## 2013-06-21 DIAGNOSIS — J4540 Moderate persistent asthma, uncomplicated: Secondary | ICD-10-CM

## 2013-06-21 MED ORDER — LORATADINE 10 MG PO TABS: 10.0000 mg | ORAL_TABLET | Freq: Every day | ORAL | Status: DC

## 2013-06-21 NOTE — Progress Notes (Signed)
  Jon Bowen - 53 y.o. male MRN [id number]  Date of birth: 14-Feb-1960  CC, SUBJECTIVE & ROS:     If applicable, see problem based charting for additional problem specific documentation. Chief Complaint  Patient presents with  . Asthma  . Allergic Rhinitis   . Headache   Reports a two-week history of worsening cough and wheezing.  He reported fevers and malaise that have now resolved however he has had persistent cough and wheezing since that time.  Additionally he ran out of his Symbicort approximately 10 days ago after the illness started.  He is having some nighttime awakening at nighttime cough.    Underlying he also reports increased nasal congestion and itchy eyes for the past 2 months.  Not taking any antihistamine.   HISTORY: Past Medical, Surgical, Social, and Family History Reviewed & Updated per EMR.  Pertinent Historical Findings include: History of allergic rhinitis, asthma, hyperlipidemia,  OBJECTIVE:  VS: BP:117/82 mmHg  HR:67bpm  TEMP:98.2 F (36.8 C)(Oral)  RESP:97 %  HT:5\' 4"  (162.6 cm)   WT:163 lb (73.936 kg)  BMI:28 PHYSICAL EXAM:  GENERAL:  Adult Hispanic male. In no discomfort; no respiratory distress PSYCH:  alert and appropriate, good insight  HNEENT:  mmm, no JVD CARDIAC:  RRR, S1/S2 heard, no murmur LUNGS:  Diffuse end expiratory wheezes in all lung fields.  No crackles. EXTREM:  Warm, well perfused.  Moves all 4 extremities spontaneously; no lateralization.  Pedal pulses 1/4.  No pretibial edema.  ASSESSMENT & PLAN: See problem based charting & AVS for pt instructions.

## 2013-06-21 NOTE — Assessment & Plan Note (Signed)
Acute flare of chronic condition  - patient reported doing well until what sound like an acute viral illness 2 weeks ago.  He also ran out of his Symbicort soon thereafter and has had persistent cough and shortness of breath.  Wheezing on exam today however no respiratory distress with good air movement and no hypoxia. 1. Refill Symbicort as soon as possible (pres[facility] at [facility] 2. Albuterol every 4 hours when necessary 3. Restart antihistamine.  Claritin, Rx provided we'll likely get OTC > Followup wheezing with peak flows had followup with PCP in 2 weeks.

## 2013-06-21 NOTE — Patient Instructions (Signed)
It was nice to meet you Pick up your Symbicort ASAP.  Use your Albuterol every 4 hours as needed.  Start Claritin (Loratadine) daily.  I have sent in a prescription but it may be quicker if you get it over the counter.  Keep your appointment with Dr. Mauricio Po on the 16

## 2013-06-28 ENCOUNTER — Other Ambulatory Visit: Payer: Self-pay | Admitting: *Deleted

## 2013-06-29 MED ORDER — MOMETASONE FUROATE 50 MCG/ACT NA SUSP: 2.0000 | Freq: Every day | NASAL | Status: DC

## 2013-07-04 ENCOUNTER — Ambulatory Visit (INDEPENDENT_AMBULATORY_CARE_PROVIDER_SITE_OTHER): Payer: No Typology Code available for payment source | Admitting: Family Medicine

## 2013-07-04 ENCOUNTER — Encounter: Payer: Self-pay | Admitting: Family Medicine

## 2013-07-04 VITALS — BP 124/86 | HR 69 | Ht 64.0 in | Wt 163.0 lb

## 2013-07-04 DIAGNOSIS — R7301 Impaired fasting glucose: Secondary | ICD-10-CM

## 2013-07-04 DIAGNOSIS — J4540 Moderate persistent asthma, uncomplicated: Secondary | ICD-10-CM

## 2013-07-04 DIAGNOSIS — J45909 Unspecified asthma, uncomplicated: Secondary | ICD-10-CM

## 2013-07-04 DIAGNOSIS — E781 Pure hyperglyceridemia: Secondary | ICD-10-CM

## 2013-07-04 DIAGNOSIS — Z1211 Encounter for screening for malignant neoplasm of colon: Secondary | ICD-10-CM

## 2013-07-04 DIAGNOSIS — Z125 Encounter for screening for malignant neoplasm of prostate: Secondary | ICD-10-CM

## 2013-07-04 LAB — COMPREHENSIVE METABOLIC PANEL
ALBUMIN: 4.1 g/dL (ref 3.5–5.2)
ALT: 31 U/L (ref 0–53)
AST: 32 U/L (ref 0–37)
Alkaline Phosphatase: 65 U/L (ref 39–117)
BILIRUBIN TOTAL: 0.7 mg/dL (ref 0.2–1.2)
BUN: 9 mg/dL (ref 6–23)
CO2: 26 mEq/L (ref 19–32)
CREATININE: 0.82 mg/dL (ref 0.50–1.35)
Calcium: 9 mg/dL (ref 8.4–10.5)
Chloride: 104 mEq/L (ref 96–112)
GLUCOSE: 87 mg/dL (ref 70–99)
Potassium: 3.9 mEq/L (ref 3.5–5.3)
Sodium: 137 mEq/L (ref 135–145)
Total Protein: 7 g/dL (ref 6.0–8.3)

## 2013-07-04 LAB — LIPID PANEL
Cholesterol: 226 mg/dL — ABNORMAL HIGH (ref 0–200)
HDL: 52 mg/dL (ref >39)
LDL CALC: 158 mg/dL — AB (ref 0–99)
TRIGLYCERIDES: 79 mg/dL (ref <150)
Total CHOL/HDL Ratio: 4.3 Ratio
VLDL: 16 mg/dL (ref 0–40)

## 2013-07-04 MED ORDER — BUDESONIDE-FORMOTEROL FUMARATE 160-4.5 MCG/ACT IN AERO: 2.0000 | INHALATION_SPRAY | Freq: Two times a day (BID) | RESPIRATORY_TRACT | Status: DC

## 2013-07-05 ENCOUNTER — Encounter: Payer: Self-pay | Admitting: Family Medicine

## 2013-07-05 LAB — PSA: PSA: 1.85 ng/mL (ref <=4.00)

## 2013-07-05 NOTE — Progress Notes (Signed)
   Subjective:    Patient ID: Jon Bowen, male    DOB: [birth year], 53 y.o.   MRN: [id number]  HPI Visit in Spanish.  Jon Bowen comes in for health maintenance.  He reports his asthma has been well-controlled recently, using Symbicort as directed and recovered from his prior acute cough/viral illness.  Takes Zyrtec sporadically for seasonal allergies, not taking now. Continues to play soccer and work in Aeronautical engineerlandscaping on the side.    Social Hx; He does not smoke; drinks 4 beers at social event perhaps 2 times a month.  No recreational drugs.    Family Hx; Father died at age 51, had DM.  No known heart disease in family. No prostate or colorectal cancer in family.   ROS: No fevers or chills, no changes in vision. No cough, no chest pain or shortness of breath. No decreased appetite. NO changes in weight. No night sweats. No nausea or vomiting, no blood in stool.  Has had chronic constipation, no changes in bowel habits. No abdominal pain. No nocturia. Does not have decreased urinary stream nor see blood in urine. No leg swelling.   Review of Systems     Objective:   Physical Exam Well appearing, no apparent distress HEENT Neck supple, no cervical adenopathy. Clear oropharynx. EOMI> PERRL.  COR Regular S1S2, no extra sounds PULM Clear bilaterally, no rales or wheezes ABD Soft, nontender, nondistended. No appreciable masses on palpation. No CVA tenderness.  EXT Full active ROM in all extremities. No ankle edema noted. NEURO Gait unremarkable, unassisted.        Assessment & Plan:

## 2013-07-05 NOTE — Assessment & Plan Note (Signed)
July 04, 2013: Long detailed discussion about recommendations for colon cancer screening in patients over age 51.  He is of average risk for this condition.  He has the [employer] Card.  Discussed options of colonoscopy every 10 yrs versus stool cards annually in our office.  Due to convenience and cost considerations, he opts for stool cards. To be given at discharge today with instructions for resubmission.

## 2013-07-05 NOTE — Assessment & Plan Note (Signed)
Patient of average risk for prostate cancer, no symptoms of BPH or prostate cancer on ROS.  Detailed discussion with patient today regarding risks/benefits of PSA and DRE for prostate cancer screening; he reports he [facility] free screening [facility] about [facility].  After discussing benefits and limitation of screening, he requests PSA for screening, declines DRE.

## 2013-07-05 NOTE — Assessment & Plan Note (Signed)
Well controlled on maintenance medication, to continue.

## 2013-07-19 ENCOUNTER — Other Ambulatory Visit: Payer: Self-pay | Admitting: *Deleted

## 2013-07-19 MED ORDER — MOMETASONE FUROATE 50 MCG/ACT NA SUSP: 2.0000 | Freq: Every day | NASAL | Status: DC

## 2013-07-20 LAB — POC HEMOCCULT BLD/STL (HOME/3-CARD/SCREEN)
Card #3 Fecal Occult Blood, POC: NEGATIVE
FECAL OCCULT BLD: NEGATIVE
Fecal Occult Blood, POC: NEGATIVE

## 2013-07-20 NOTE — Addendum Note (Signed)
Addended by: Swazil[place]JORDAN, Kevron Patella on: 07/20/2013 05:04 PM   Modules accepted: Orders

## 2013-08-24 ENCOUNTER — Encounter: Payer: Self-pay | Admitting: Family Medicine

## 2013-08-24 ENCOUNTER — Other Ambulatory Visit: Payer: Self-pay | Admitting: *Deleted

## 2013-08-24 MED ORDER — MOMETASONE FUROATE 50 MCG/ACT NA SUSP: 2.0000 | Freq: Every day | NASAL | Status: DC

## 2013-09-27 ENCOUNTER — Other Ambulatory Visit: Payer: Self-pay | Admitting: *Deleted

## 2013-09-28 ENCOUNTER — Other Ambulatory Visit: Payer: Self-pay | Admitting: *Deleted

## 2013-09-29 MED ORDER — MOMETASONE FUROATE 50 MCG/ACT NA SUSP: 2.0000 | Freq: Every day | NASAL | Status: DC

## 2013-09-29 MED ORDER — BUDESONIDE-FORMOTEROL FUMARATE 160-4.5 MCG/ACT IN AERO: 2.0000 | INHALATION_SPRAY | Freq: Two times a day (BID) | RESPIRATORY_TRACT | Status: DC

## 2013-12-19 ENCOUNTER — Ambulatory Visit: Payer: Self-pay

## 2013-12-26 ENCOUNTER — Encounter: Payer: Self-pay | Admitting: Family Medicine

## 2013-12-26 ENCOUNTER — Ambulatory Visit (INDEPENDENT_AMBULATORY_CARE_PROVIDER_SITE_OTHER): Payer: Self-pay | Admitting: Family Medicine

## 2013-12-26 VITALS — BP 104/65 | HR 90 | Temp 98.4°F | Ht 64.0 in | Wt 164.8 lb

## 2013-12-26 DIAGNOSIS — J4521 Mild intermittent asthma with (acute) exacerbation: Secondary | ICD-10-CM

## 2013-12-26 DIAGNOSIS — J4531 Mild persistent asthma with (acute) exacerbation: Secondary | ICD-10-CM

## 2013-12-26 DIAGNOSIS — J45901 Unspecified asthma with (acute) exacerbation: Secondary | ICD-10-CM | POA: Insufficient documentation

## 2013-12-26 DIAGNOSIS — R0989 Other specified symptoms and signs involving the circulatory and respiratory systems: Secondary | ICD-10-CM

## 2013-12-26 MED ORDER — PREDNISONE 5 MG PO TABS: 50.0000 mg | ORAL_TABLET | Freq: Every day | ORAL | Status: DC

## 2013-12-26 MED ORDER — ALBUTEROL SULFATE (2.5 MG/3ML) 0.083% IN NEBU
5.0000 mg | INHALATION_SOLUTION | Freq: Once | RESPIRATORY_TRACT | Status: AC
  Administered 2013-12-26: 5 mg via RESPIRATORY_TRACT

## 2013-12-26 NOTE — Assessment & Plan Note (Signed)
Asthma exacerbation with trigger URI viral infection.  Responds well to albuterol neb treatment; no rales on lung exam following nebs. Discussed treatment with prednisone burst and albuterol HFA; indications for CXR, which is ordered in case of worsening/lack of response to prednisone. Tylenol/ibuprofen for aches and fevers. Follow up in 1 week or sooner as needed.  I will call if I see CXR results come into inbox.

## 2013-12-26 NOTE — Progress Notes (Signed)
   Subjective:    Patient ID: Lorenza Cambridgeduardo Rodriguez-Mickle, male    DOB: [birth year], 53 y.o.   MRN: [id number]  HPI Visit in Spanish.Domingo Cocking. Eloy reports 3 days of wheezes, developed aches and fevers last night. Daughter and others in family have been sick with respiratory illness. Last night Domingo Cockingduardo had chills and fevers. Took Flanax ([place] brand of naproxen) and this relieved his fevers/chills. No antipyretics this morning.   He reports cough, wheezes and production of some green non-bloody phlegm. Using Ventolin HFA about every 4 hours for relief.  Is not using Symbicort at the moment.     Review of Systems     Objective:   Physical Exam Generally well appearing, no apparent distress HEENT Neck supple, no cervical adenopathy. Injected oropharynx without exudates. TMs clear bilaterally. No frontal or maxillary sinus tenderness.  COR Regular S1S2, no extra sounds PULM Diffuse wheezing initially.  Albuterol treatment administered with improvement in wheezes. No rales or rhonchi noted after treatment.        Assessment & Plan:

## 2013-12-26 NOTE — Patient Instructions (Signed)
fue un placer verle.   Para el asma,  PREDNISONA 5mg , tome 10 tabletas por boca (con algo de comer), una vez por dia, por 7 dias.   Siga usando la Ventolin 2 soplidos cada 4-6 horas segun necesite.  Flanax o Tylenol como necesite para Baristael malestar corporal.   Si no se le mejora la tos, vaya a sacarse la placa en [facility].  FOLLOW UP WITH DR Mauricio PoBREEN ON Friday, December 18TH.

## 2014-01-05 ENCOUNTER — Encounter: Payer: Self-pay | Admitting: Family Medicine

## 2014-01-05 ENCOUNTER — Ambulatory Visit (INDEPENDENT_AMBULATORY_CARE_PROVIDER_SITE_OTHER): Payer: Self-pay | Admitting: Family Medicine

## 2014-01-05 VITALS — BP 113/74 | HR 77 | Temp 97.8°F | Ht 64.0 in | Wt 163.1 lb

## 2014-01-05 DIAGNOSIS — J4521 Mild intermittent asthma with (acute) exacerbation: Secondary | ICD-10-CM

## 2014-01-05 DIAGNOSIS — J189 Pneumonia, unspecified organism: Secondary | ICD-10-CM | POA: Insufficient documentation

## 2014-01-05 MED ORDER — AZITHROMYCIN 250 MG PO TABS: ORAL_TABLET | ORAL | Status: DC

## 2014-01-05 MED ORDER — PREDNISONE 5 MG PO TABS: 50.0000 mg | ORAL_TABLET | Freq: Every day | ORAL | Status: DC

## 2014-01-05 NOTE — Assessment & Plan Note (Signed)
Paroxysmal cough with emesis, temporary improvement with short burst steroids but resumes with cough and symptoms when course of prednisone ends. For empiric treatment with azithromycin, repeat course of prednisone for another 7 days. He has a standing order for CXR to get done if not making marked improvement in the coming week. I would like to follow up with him in the coming 2 weeks.

## 2014-01-05 NOTE — Patient Instructions (Signed)
Fue un Research officer, trade unionplacer verle hoy.    Creo que tiene un variante de neumonia que se tiene que tratar con antibioticos.   AZITHROMYCIN 250mg , tome 2 tabletas por boca en el primer dia, despues 1 tableta diaria por 4 dias mas.   PREDNISONE 50mg  por dia (tabletas de 5mg , tome 10 tabletas por dia por un total de 7 dias).  SI NO ESTA' COMPLETAMENTE MEJOR ANTES DEL DIA 27 DICIEMBRE< QUIERO QUE VAYA A SACARSE LA PLACA QUE YA ESTA" ORDENADA EN [facility].  QUIERO VERLE DE NUEVO DESPUES DE LA PLACA SI NO ESTA' MEJOR.

## 2014-01-05 NOTE — Progress Notes (Signed)
   Subjective:    Patient ID: Jon Bowen, male    DOB: 1960-05-27, 53 y.o.   MRN: [id number]  HPI Visit in Spanish. Patient reports he had marked improvement in his cough and wheeze while on the seven-day course of prednisone 50mg /day; the symptoms came back as soon as the steroids were over. He had gotten well enough during the prednisone treatment to be able to play soccer. He reports to feeling mildly ill with the persistent paroxysms of cough. Has had post-tussive emesis as well. No fevers. The cough and chest congestion seem worse in the nighttime.   He did not get the CXR done as we discussed at previous visit, citing his rapid initial improvement as the reason.   Social Hx; Nonsmoker.  51 yr old daughter recently ill with a cough/respiratory illness.     Review of Systems     Objective:   Physical Exam Well appearing, no apparent distress HEENT neck supple, no cervical adenopathy. TMs clear. Clear oropharynx. No facial tenderness or frontal/maxillary sinus tenderness.  COR Regular S1S2, no extra sounds PULM Generalized wheezes and rhonchi throughout. Moving air well.         Assessment & Plan:

## 2014-01-31 ENCOUNTER — Other Ambulatory Visit: Payer: Self-pay | Admitting: *Deleted

## 2014-01-31 DIAGNOSIS — J45998 Other asthma: Secondary | ICD-10-CM

## 2014-01-31 MED ORDER — ALBUTEROL SULFATE HFA 108 (90 BASE) MCG/ACT IN AERS: 2.0000 | INHALATION_SPRAY | RESPIRATORY_TRACT | Status: DC | PRN

## 2014-02-13 ENCOUNTER — Other Ambulatory Visit: Payer: Self-pay | Admitting: *Deleted

## 2014-02-14 ENCOUNTER — Encounter: Payer: Self-pay | Admitting: Family Medicine

## 2014-02-14 ENCOUNTER — Ambulatory Visit (INDEPENDENT_AMBULATORY_CARE_PROVIDER_SITE_OTHER): Payer: Self-pay | Admitting: Family Medicine

## 2014-02-14 VITALS — BP 118/77 | HR 76 | Temp 97.6°F | Ht 64.0 in | Wt 168.0 lb

## 2014-02-14 DIAGNOSIS — J069 Acute upper respiratory infection, unspecified: Secondary | ICD-10-CM

## 2014-02-14 DIAGNOSIS — J45998 Other asthma: Secondary | ICD-10-CM

## 2014-02-14 MED ORDER — GUAIFENESIN ER 600 MG PO TB12: 600.0000 mg | ORAL_TABLET | Freq: Two times a day (BID) | ORAL | Status: DC | PRN

## 2014-02-14 MED ORDER — ALBUTEROL SULFATE HFA 108 (90 BASE) MCG/ACT IN AERS: 2.0000 | INHALATION_SPRAY | RESPIRATORY_TRACT | Status: DC | PRN

## 2014-02-14 MED ORDER — ACETAMINOPHEN 500 MG PO TABS: 500.0000 mg | ORAL_TABLET | Freq: Four times a day (QID) | ORAL | Status: DC | PRN

## 2014-02-14 MED ORDER — BUDESONIDE-FORMOTEROL FUMARATE 160-4.5 MCG/ACT IN AERO: 2.0000 | INHALATION_SPRAY | Freq: Two times a day (BID) | RESPIRATORY_TRACT | Status: DC

## 2014-02-14 MED ORDER — MOMETASONE FUROATE 50 MCG/ACT NA SUSP: 2.0000 | Freq: Every day | NASAL | Status: DC

## 2014-02-14 NOTE — Patient Instructions (Signed)
Thank you for coming to see me today. It was a pleasure. Today we talked about:   Upper respiratory infection: this appears to be a viral infection. I do not suspect a bacterial infection. Your lung exam sounded great with the exception of very faint wheezing. I will refill your Albuterol and Symbicort. I will also write you a prescription for Tylenol and Mucinex to take for your symptoms as needed. If your symptoms worsen, or don't improve in the next 7-10 days, please return for follow-up.  If you have any questions or concerns, please do not hesitate to call the office at [phone].  Sincerely,  Jacquelin Hawkingalph Nettey, MD   Infeccin de las vas areas superiores en los adultos (Upper Respiratory Infection, Adult)  La infeccin respiratoria de las vas areas superiores se conoce tambin como resfro comn. Las vas areas superiores Baxter Internationalincluyen los senos nasales, la garganta, la trquea, y los bronquios. Los bronquios son las vas areas que conducen el aire a los pulmones. La mayor parte de las personas mejora luego de una Bremertonsemana, Biomedical engineerpero los sntomas pueden durar The Interpublic Group of Companieshasta dos semanas. La tos residual puede durar ms. CAUSAS Varios tipos de virus pueden causar la infeccin de los tejidos que cubren las vas areas superiores. Los tejidos se irritan y se inflaman y se originan secreciones. Tambin es frecuente la produccin de moco. El resfro es contagioso. El virus se disemina fcilmente a otras personas por contacto oral. Aqu se incluyen los besos, el compartir un vaso y el toser o Engineering geologistestornudar. Tambin puede diseminarse tocndose la boca o la Portugalnariz y luego tocando una superficie que luego tocan Economistotras personas.  SNTOMAS Los sntomas se desarrollan entre uno y Hernandezlandtres das luego de Cytogeneticistentrar en contacto con el virus. Pueden variar de Neomia Dearuna persona a otra. Incluyen:  Secrecin nasal.  Estornudos  Congestin nasal.  Irritacin de los senos nasales.  Dolor de Advertising copywritergarganta.  Prdida de la voz  (laringitis).  Tos.  Fatiga.  Dolores musculares.  Prdida del apetito.  Dolor de Turkmenistancabeza.  Fiebre no muy elevada. DIAGNSTICO Puede diagnosticarse a s mismo la infeccin respiratoria, segn los sntomas habituales, ya que la mayor parte de las personas se resfra dos o tres veces al ao. El profesional puede confirmarlo basndose en el examen fsico. Lo ms importante es que el profesional verifique que los sntomas no se deben a otra enfermedad como anginas, sinusitis, neumona, asma o epiglotitis. Para diagnosticar el resfrio comn, no es necesario que haga anlisis de Millersvillesangre, pruebas en la garganta o radiografas, pero en algunos casos puede ser de utilidad para excluir otros problemas ms graves. El mdico decidir si necesita otras pruebas. RIESGOS Y COMPLICACIONES Tendr mayor riesgo de sufrir un resfro grave si consume cigarrillos, sufre una enfermedad cardaca (como insuficiencia cardaca) o pulmonar crnica (como asma) o si tiene un debilitamiento del sistema inmunolgico. Las personas muy jvenes o muy mayores tienen riesgo de sufrir infecciones ms graves. La sinusitis bacteriana, las infecciones del odo medio y la neumona bacteriana pueden complicar el resfro comn. El resfro puede exacerbar el asma y la enfermedad pulmonar obstructiva crnica. En algunos casos estas complicaciones requieren la atencin en un servicio de emergencias y pueden poner en peligro la vida. PREVENCIN La mejor manera de protegerse para no contraer un resfro es Pharmacologistmantener una buena higiene. Evite el contacto bucal o de las manos con personas con sntomas de resfro. Si se produce el contacto, lvese las manos con frecuencia. No hay pruebas firmes que indiquen que la vitamina  C, la vitamina E, la equincea o la actividad fsica reduzcan las posibilidades de tener una infeccin. Sin embargo, siempre se recomienda Insurance account manager y Winferd Humphrey buena nutricin. TRATAMIENTO El tratamiento est dirigido a  Consulting civil engineer sntomas. Esta enfermedad no tiene [place]. Los antibiticos no son eficaces, ya que esta infeccin la causa un virus y no una bacteria. El tratamiento incluye:  Aumente la ingesta de lquidos. Consumo de bebidas deportivas, que proporcionan electrolitos,azcares e hidratacin.  Inhale vapor caliente (de un vaporizador o de la ducha).  Tomar sopa de pollo u otros lquidos claros, y [employer] buena nutricin.  Descanse lo suficiente.  Haga grgaras o coma pastillas para [employer].  Control de la fiebre con ibuprofeno o acetaminofen, segn las indicaciones del mdico.  Aumento del uso del inhalador, si sufre asma. Las pastillas y los geles de zinc durante las primeras 24 horas de iniciado el resfro comn, pueden disminuir la duracin y Paramedic la gravedad de los sntomas. Los medicamentos para Chief Technology Officer pueden disminuir la fiebre, Paramedic los dolores musculares y Chief Technology Officer de Advertising copywriter. Se dispone de una gran variedad de medicamentos de venta libre para tratar la congestin y la secrecin nasal. El profesional podr recomendarle inhalantes para los otros sntomas. INSTRUCCIONES PARA EL CUIDADO DOMICILIARIO  Utilice los medicamentos de venta libre o de prescripcin para Chief Technology Officer, el malestar o la [place], segn se lo indique el profesional que lo asiste.  Utilice un vaporizador caliente o inhale vapor, haciendo salir agua de la ducha para aumentar la humedad [place]. Esto mantendr las secreciones hmedas y Community education officer ms fcil respirar.  Beba gran cantidad de lquido para mantener la orina de tono claro o color amarillo plido.  Descanse todo lo que pueda.  Regrese a su trabajo cuando la temperatura se haya normalizado, o cuando el profesional que lo asiste se lo indique. Quizs sea necesario que permanezca en su casa durante un tiempo ms prolongado para Buyer, retail a Economist. Tambin puede utilizar un barbijo y ser cuidadoso con el lavado de manos para  evitar la diseminacin del virus. SOLICITE ATENCIN MDICA SI:  Luego de los primeros das siente que empeora en vez de [place].  Necesita que Occupational psychologist brinde ms informacin relacionada con los medicamentos para [employer] sntomas.  Siente escalofros, le falta el aire o escupe moco de color marrn o rojo. Estos pueden ser sntomas de neumona.  Tiene una secrecin nasal de color amarillo o marrn, o siente dolor en el rostro, especialmente cuando se inclina hacia adelante. Estos pueden ser sntomas de sinusitis.  Tiene fiebre, siente el cuello hinchado, tiene dolor al tragar u observa manchas blancas en el fondo de la garganta. Estos pueden ser sntomas de angina por estreptococo. Romona Curls ATENCIN MDICA DE INMEDIATO SI:  Lance Muss.  Comienza a sentir Herbalist de cabeza intenso o persistente, dolor de odos, en el seno nasal o en el pecho.  Tiene tos y esta se prolonga demasiado, tose y escupe sangre, la mucosidad habitual se modifica (si tiene una enfermedad pulmonar crnica) o respira con dificultad.  Siente rigidez en el cuello o dolor de cabeza intenso. Document Released: 10/15/2004 Document Revised: 03/30/2011 [facility] Patient Information 2015 [place], [place]. This information is not intended to replace advice given to you by your health care provider. Make sure you discuss any questions you have with your health care provider.

## 2014-02-14 NOTE — Progress Notes (Signed)
    Subjective   Jon Bowen is a 54 y.o. male that presents for a same day visit  1. Upper respiratory symptoms: Symptoms started 4 days ago with runny nose, mild productive cough, wheezing, congestion, sneezing and body aches. Some mild subjective fever, no chills and no sweats. Symptoms worse in the morning. He has taken Nyquil, which has not helped. He does not have any albuterol left. He also does not use his Symbicort as he ran out of his medication. No sick contact. Patient was recently treated for atypical pneumonia with azithromycin. Overall, his symptoms have been improving.  History  Substance Use Topics  . Smoking status: Never Smoker   . Smokeless tobacco: Never Used  . Alcohol Use: Not on file    ROS Per HPI  Objective   BP 118/77 mmHg  Pulse 76  Temp(Src) 97.6 F (36.4 C) (Oral)  Ht 5\' 4"  (1.626 m)  Wt 168 lb (76.204 kg)  BMI 28.82 kg/m2  General: Well appearing male HEENT: TMs clear bilaterally, no frontal or maxillary tenderness, sclera white, nasal mucosa mildly edematous and erythematous, oropharynx clear and without erythema, no cervical lymphadenopathy Respiratory/Chest: Very faint wheezes bilaterally. No decreased breath sounds. No rales.  Assessment and Plan   Upper respiratory infection: seems to be improving. Discussed why antibiotic therapy would not be the method of treatment at this stage of illness. Do not suspect pneumonia.  Refill Symbicort and albuterol prescriptions  Prescribe Tylenol 500mg  QID and Mucinex 600mg  BID  Return precautions discussed with patient and he voiced understanding

## 2014-04-11 ENCOUNTER — Other Ambulatory Visit: Payer: Self-pay | Admitting: *Deleted

## 2014-04-11 DIAGNOSIS — J45998 Other asthma: Secondary | ICD-10-CM

## 2014-04-12 MED ORDER — ALBUTEROL SULFATE HFA 108 (90 BASE) MCG/ACT IN AERS: 2.0000 | INHALATION_SPRAY | RESPIRATORY_TRACT | Status: DC | PRN

## 2014-04-26 ENCOUNTER — Other Ambulatory Visit: Payer: Self-pay | Admitting: *Deleted

## 2014-04-26 DIAGNOSIS — J45998 Other asthma: Secondary | ICD-10-CM

## 2014-04-27 MED ORDER — ALBUTEROL SULFATE HFA 108 (90 BASE) MCG/ACT IN AERS: 2.0000 | INHALATION_SPRAY | RESPIRATORY_TRACT | Status: DC | PRN

## 2014-06-20 ENCOUNTER — Encounter: Payer: Self-pay | Admitting: Family Medicine

## 2014-06-20 ENCOUNTER — Ambulatory Visit (INDEPENDENT_AMBULATORY_CARE_PROVIDER_SITE_OTHER): Payer: Self-pay | Admitting: Family Medicine

## 2014-06-20 ENCOUNTER — Ambulatory Visit (HOSPITAL_COMMUNITY)
Admission: EM | Admit: 2014-06-20 | Discharge: 2014-06-20 | Disposition: A | Discharge status: Home / Self Care | Payer: Self-pay | Source: Intra-hospital | Attending: Emergency Medicine | Admitting: Emergency Medicine

## 2014-06-20 ENCOUNTER — Ambulatory Visit (HOSPITAL_COMMUNITY)
Admission: RE | Admit: 2014-06-20 | Discharge: 2014-06-20 | Disposition: A | Discharge status: Home / Self Care | Payer: Self-pay | Source: Ambulatory Visit | Attending: Family Medicine | Admitting: Family Medicine

## 2014-06-20 VITALS — BP 127/81 | HR 67 | Temp 97.8°F | Ht 64.0 in | Wt 162.0 lb

## 2014-06-20 DIAGNOSIS — S62631A Displaced fracture of distal phalanx of left index finger, initial encounter for closed fracture: Secondary | ICD-10-CM | POA: Insufficient documentation

## 2014-06-20 DIAGNOSIS — X58XXXA Exposure to other specified factors, initial encounter: Secondary | ICD-10-CM | POA: Insufficient documentation

## 2014-06-20 DIAGNOSIS — S6992XA Unspecified injury of left wrist, hand and finger(s), initial encounter: Secondary | ICD-10-CM

## 2014-06-20 NOTE — Patient Instructions (Addendum)
It was a pleasure seeing you today in our clinic. Today we discussed your injured index finger. Here is the treatment plan we have discussed and agreed upon together:   - I have ordered to have an X-ray taken of this finger. - I would like to have you pick up a finger splint from the nearest drug store. I have provided you a picture of the splint I recommend. I ask that you wear this every day, all day (you can take it off to bathe) for 6-8 weeks - I would like to see you back in 1 week to revaluate.  - For pain, take Tylenol. Do not take Ibuprofen, Motrin, or Aleve.

## 2014-06-25 DIAGNOSIS — S6990XA Unspecified injury of unspecified wrist, hand and finger(s), initial encounter: Secondary | ICD-10-CM | POA: Insufficient documentation

## 2014-06-25 NOTE — Assessment & Plan Note (Signed)
Patient presents w/ a jamming injury of the Lt index finger. Differential includes DIP subluxation w/ soft tissue/capsular irritation vs. extensor tendon rupture/partial tear vs tendon abruption from distal phalange. - XR warranted at this time. Ordered AP/Lat/Oblique - Given instructions to purchase finger splint at local drug store. Provided name and photograph. Ensured price to be <$10 at [facility] according to website. I asked that he use this for 6-8 weeks. - Instructed Tylenol for pain - Ice and rest - I'd like to see him back in 1 week.

## 2014-06-25 NOTE — Progress Notes (Signed)
   HPI  CC: Jammed Lt index finger Injury to Lt index finger last week. Was trying to make a hole in a plastic wrapped spool at work and jammed his index finger in the process. Patient denies any deformity or ecchymosis. Initial pain was severe but improved after 3-4 days. Now pain is constant. Pain w/ palpation and movement. Swelling and pain localized to dorsal surface of the DIP. Patient seems mostly concerned w/ new baseline flexion of the DIP. Asking if this can be fixed.  Also: states that Halliburton Companyrange Card runs out today and would like to do as much as possible today.  Review of Systems   See HPI for ROS. All other systems reviewed and are negative.  Past medical history and social history reviewed and updated in the EMR as appropriate.  Objective: BP 127/81 mmHg  Pulse 67  Temp(Src) 97.8 F (36.6 C) (Oral)  Ht 5\' 4"  (1.626 m)  Wt 162 lb (73.483 kg)  BMI 27.79 kg/m2 Gen: NAD, alert, cooperative, and pleasant. HEENT: NCAT, EOMI, PERRL CV: RRR, no murmur Resp: CTAB, no wheezes, non-labored Abd: SNTND, BS present, no guarding or organomegaly Ext: Warm, good tone and strength. Edema noted at Lt index finger's DIP. No ecchymosis. TTP at dorsolateral surfaces of DIP. Passive ROM preserved w/ some mild pain. Active ROM preserved w/ flexion but very painful and 2/5 w/ extension. No pain at the fingertip. Sensation and cap refill preserved throughout. Otherwise unremarkable. Neuro: Alert and oriented, Speech clear, No gross deficits  Assessment and plan:  Finger injury Patient presents w/ a jamming injury of the Lt index finger. Differential includes DIP subluxation w/ soft tissue/capsular irritation vs. extensor tendon rupture/partial tear vs tendon abruption from distal phalange. - XR warranted at this time. Ordered AP/Lat/Oblique - Given instructions to purchase finger splint at local drug store. Provided name and photograph. Ensured price to be <$10 [facility] according to website. I asked  that he use this for 6-8 weeks. - Instructed Tylenol for pain - Ice and rest - I'd like to see him back in 1 week.     Orders Placed This Encounter  Procedures  . DG Finger Index Left    Standing Status: Future     Number of Occurrences: 1     Standing Expiration Date: 08/20/2015    Scheduling Instructions:     AP, Lateral, and Oblique    Order Specific Question:  Reason for Exam (SYMPTOM  OR DIAGNOSIS REQUIRED)    Answer:  Finger Injury; r/o fracture    Order Specific Question:  [facility]    [facility]    Kathee DeltonIan D Tennyson Wacha, MD,MS,  [id number] 06/25/2014 9:16 PM

## 2014-11-16 ENCOUNTER — Telehealth: Payer: Self-pay | Admitting: *Deleted

## 2014-11-16 NOTE — Telephone Encounter (Signed)
[facility] Nexium is no longer available for free, please consider writing a new Rx for Dexilant.  Clovis PuMartin, Tamika L, RN

## 2014-11-19 NOTE — Telephone Encounter (Signed)
Will wait to reassess status at next visit before starting new med.

## 2015-07-04 ENCOUNTER — Other Ambulatory Visit: Payer: Self-pay | Admitting: *Deleted

## 2015-07-04 DIAGNOSIS — J45998 Other asthma: Secondary | ICD-10-CM

## 2015-07-06 MED ORDER — ALBUTEROL SULFATE HFA 108 (90 BASE) MCG/ACT IN AERS: 2.0000 | INHALATION_SPRAY | RESPIRATORY_TRACT | Status: DC | PRN

## 2015-11-21 ENCOUNTER — Encounter (HOSPITAL_COMMUNITY): Payer: Self-pay | Admitting: Emergency Medicine

## 2015-11-21 ENCOUNTER — Ambulatory Visit (HOSPITAL_COMMUNITY)
Admission: EM | Admit: 2015-11-21 | Discharge: 2015-11-21 | Disposition: A | Discharge status: Home / Self Care | Payer: Self-pay | Attending: Emergency Medicine | Admitting: Emergency Medicine

## 2015-11-21 DIAGNOSIS — J40 Bronchitis, not specified as acute or chronic: Secondary | ICD-10-CM

## 2015-11-21 MED ORDER — PREDNISONE 50 MG PO TABS: ORAL_TABLET | ORAL | 0 refills | Status: DC

## 2015-11-21 MED ORDER — BENZONATATE 100 MG PO CAPS: 100.0000 mg | ORAL_CAPSULE | Freq: Three times a day (TID) | ORAL | 0 refills | Status: DC

## 2015-11-21 MED ORDER — AMOXICILLIN 500 MG PO CAPS: 500.0000 mg | ORAL_CAPSULE | Freq: Three times a day (TID) | ORAL | 0 refills | Status: DC

## 2015-11-21 NOTE — ED Provider Notes (Signed)
[facility]    CSN: [id number] Arrival date & time: 11/21/15  1729     History   Chief Complaint Chief Complaint  Patient presents with  . URI    HPI Jon Bowen is a 55 y.o. male.   HPI He is a 55 year old man here for evaluation of cough. He reports a two-week history of nasal congestion, rhinorrhea, sore throat, hoarseness, and cough. Cough was initially productive, but is now dry. He reports feeling a lot of congestion in his chest. He has had some headache related to the cough. No fevers. He has been taking over-the-counter medication with minimal improvement. He also reports some intermittent chest tightness that is resolved with his albuterol inhaler.  History reviewed. No pertinent past medical history.  Patient Active Problem List   Diagnosis Date Noted  . Finger injury 06/25/2014  . Atypical pneumonia 01/05/2014  . Asthma with acute exacerbation 12/26/2013  . Screening for prostate cancer 07/04/2013  . Colon cancer screening 07/04/2013  . Right knee pain 08/23/2012  . Right tennis elbow 12/02/2010  . 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 Gastrocnemius muscle tear 10/21/2010  . Ankle pain, left 08/06/2010  . Cough 07/29/2010  . Conjunctivitis of both eyes 03/31/2010  . HYPERTRIGLYCERIDEMIA 12/20/2009  . IMPAIRED FASTING GLUCOSE 12/20/2009  . DEGENERATIVE JOINT DISEASE, RIGHT KNEE 10/31/2007  . DERMATOPHYTOSIS OF FOOT 09/12/2007  . ALLERGIC RHINITIS 09/12/2007  . Asthma, moderate persistent, well-controlled 09/12/2007    History reviewed. No pertinent surgical history.     Home Medications    Prior to Admission medications   Medication Sig Start Date End Date Taking? Authorizing Provider  albuterol (VENTOLIN HFA) 108 (90 Base) MCG/ACT inhaler Inhale 2 puffs into the lungs every 4 (four) hours as needed. 07/06/15  Yes Kathee DeltonIan D McKeag, MD  guaiFENesin (MUCINEX) 600 MG 12 hr tablet Take 1 tablet (600 mg total) by mouth 2 (two) times daily as  needed for cough or to loosen phlegm. 02/14/14  Yes Narda Bondsalph A Nettey, MD  acetaminophen (TYLENOL) 500 MG tablet Take 1 tablet (500 mg total) by mouth every 6 (six) hours as needed. 02/14/14   Narda Bondsalph A Nettey, MD  amoxicillin (AMOXIL) 500 MG capsule Take 1 capsule (500 mg total) by mouth 3 (three) times daily. 11/21/15   Charm RingsErin J Shamar Engelmann, MD  benzonatate (TESSALON) 100 MG capsule Take 1 capsule (100 mg total) by mouth every 8 (eight) hours. 11/21/15   Charm RingsErin J Remas Sobel, MD  predniSONE (DELTASONE) 50 MG tablet Take 1 pill daily for 5 days. 11/21/15   Charm RingsErin J Carrol Bondar, MD    Family History History reviewed. No pertinent family history.  Social History Social History  Substance Use Topics  . Smoking status: Never Smoker  . Smokeless tobacco: Never Used  . Alcohol use Yes     Allergies   Review of patient's allergies indicates no known allergies.   Review of Systems Review of Systems As in history of present illness  Physical Exam Triage Vital Signs ED Triage Vitals  Enc Vitals Group     BP 11/21/15 1836 117/78     Pulse Rate 11/21/15 1836 70     Resp 11/21/15 1836 20     Temp 11/21/15 1836 98.3 F (36.8 C)     Temp Source 11/21/15 1836 Oral     SpO2 11/21/15 1836 100 %     Weight --      Height --      Head Circumference --  Peak Flow --      Pain Score 11/21/15 1840 4     Pain Loc --      Pain Edu? --      Excl. in GC? --    No data found.   Updated Vital Signs BP 117/78 (BP Location: Left Arm)   Pulse 70   Temp 98.3 F (36.8 C) (Oral)   Resp 20   SpO2 100%   Visual Acuity Right Eye Distance:   Left Eye Distance:   Bilateral Distance:    Right Eye Near:   Left Eye Near:    Bilateral Near:     Physical Exam  Constitutional: He is oriented to person, place, and time. He appears well-developed and well-nourished. No distress.  HENT:  Mouth/Throat: No oropharyngeal exudate.  No sinus tenderness. Nasal mucosa slightly erythematous. Oropharynx mildly erythematous.  Neck:  Neck supple.  Cardiovascular: Normal rate, regular rhythm and normal heart sounds.   No murmur heard. Pulmonary/Chest: Effort normal and breath sounds normal. No respiratory distress. He has no wheezes. He has no rales.  Lymphadenopathy:    He has no cervical adenopathy.  Neurological: He is alert and oriented to person, place, and time.     UC Treatments / Results  Labs (all labs ordered are listed, but only abnormal results are displayed) Labs Reviewed - No data to display  EKG  EKG Interpretation None       Radiology No results found.  Procedures Procedures (including critical care time)  Medications Ordered in UC Medications - No data to display   Initial Impression / Assessment and Plan / UC Course  I have reviewed the triage vital signs and the nursing notes.  Pertinent labs & imaging results that were available during my care of the patient were reviewed by me and considered in my medical decision making (see chart for details).  Clinical Course    Likely bronchitis. Will treat with amoxicillin, prednisone, and Tessalon. He will continue his albuterol as needed. Follow-up as needed.  Final Clinical Impressions(s) / UC Diagnoses   Final diagnoses:  Bronchitis    New Prescriptions New Prescriptions   AMOXICILLIN (AMOXIL) 500 MG CAPSULE    Take 1 capsule (500 mg total) by mouth 3 (three) times daily.   BENZONATATE (TESSALON) 100 MG CAPSULE    Take 1 capsule (100 mg total) by mouth every 8 (eight) hours.   PREDNISONE (DELTASONE) 50 MG TABLET    Take 1 pill daily for 5 days.     Charm RingsErin J Emmalise Huard, MD 11/21/15 [phone]

## 2015-11-21 NOTE — ED Triage Notes (Signed)
Here for cold sx onset 2 weeks associated w/chest congestion, wheezing, HA, BAs, ST, hoarseness, and prod cough  A&O x4... NAD

## 2015-11-21 NOTE — Discharge Instructions (Signed)
You have bronchitis. Take amoxicillin and prednisone as prescribed. Use tessalon as needed for cough. Use the albuterol every 4 hours as needed for wheezing or cough. You should see improvement in the next 3-5 days. If you develop fevers, difficulty breathing, or are just not getting better, please come back or go to the emergency room.

## 2016-02-09 ENCOUNTER — Encounter (HOSPITAL_COMMUNITY): Payer: Self-pay | Admitting: *Deleted

## 2016-02-09 ENCOUNTER — Ambulatory Visit (INDEPENDENT_AMBULATORY_CARE_PROVIDER_SITE_OTHER): Payer: Self-pay

## 2016-02-09 ENCOUNTER — Ambulatory Visit (HOSPITAL_COMMUNITY)
Admission: EM | Admit: 2016-02-09 | Discharge: 2016-02-09 | Disposition: A | Discharge status: Home / Self Care | Payer: Self-pay | Attending: Emergency Medicine | Admitting: Emergency Medicine

## 2016-02-09 DIAGNOSIS — J40 Bronchitis, not specified as acute or chronic: Secondary | ICD-10-CM

## 2016-02-09 HISTORY — DX: Unspecified asthma, uncomplicated: J45.909

## 2016-02-09 MED ORDER — PREDNISONE 50 MG PO TABS: ORAL_TABLET | ORAL | 0 refills | Status: DC

## 2016-02-09 MED ORDER — IPRATROPIUM-ALBUTEROL 0.5-2.5 (3) MG/3ML IN SOLN
3.0000 mL | Freq: Once | RESPIRATORY_TRACT | Status: AC
  Administered 2016-02-09: 3 mL via RESPIRATORY_TRACT

## 2016-02-09 MED ORDER — AMOXICILLIN 500 MG PO CAPS: 500.0000 mg | ORAL_CAPSULE | Freq: Three times a day (TID) | ORAL | 0 refills | Status: DC

## 2016-02-09 MED ORDER — IPRATROPIUM-ALBUTEROL 0.5-2.5 (3) MG/3ML IN SOLN
RESPIRATORY_TRACT | Status: AC
  Filled 2016-02-09: qty 3

## 2016-02-09 MED ORDER — BENZONATATE 100 MG PO CAPS: 100.0000 mg | ORAL_CAPSULE | Freq: Three times a day (TID) | ORAL | 0 refills | Status: DC

## 2016-02-09 MED ORDER — METHYLPREDNISOLONE SODIUM SUCC 125 MG IJ SOLR
125.0000 mg | Freq: Once | INTRAMUSCULAR | Status: AC
  Administered 2016-02-09: 125 mg via INTRAMUSCULAR

## 2016-02-09 MED ORDER — METHYLPREDNISOLONE SODIUM SUCC 125 MG IJ SOLR
INTRAMUSCULAR | Status: AC
  Filled 2016-02-09: qty 2

## 2016-02-09 NOTE — ED Provider Notes (Signed)
[facility]    CSN: [id number] Arrival date & time: 02/09/16  1540     History   Chief Complaint Chief Complaint  Patient presents with  . Cough  . Wheezing    HPI Jon Bowen is a 56 y.o. male.   HPI  He is a 56 year old man here with his wife for evaluation of cough and wheeze. Symptoms have been going on for 1-2 weeks. He reports subjective low-grade fevers, nasal congestion, chest congestion, and wheezing. He had been coughing a lot, but that is somewhat better today. He has been using his albuterol inhaler OTC medications with minimal improvement.  Past Medical History:  Diagnosis Date  . Asthma     Patient Active Problem List   Diagnosis Date Noted  . Finger injury 06/25/2014  . Atypical pneumonia 01/05/2014  . Asthma with acute exacerbation 12/26/2013  . Screening for prostate cancer 07/04/2013  . Colon cancer screening 07/04/2013  . Right knee pain 08/23/2012  . Right tennis elbow 12/02/2010  . 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 Gastrocnemius muscle tear 10/21/2010  . Ankle pain, left 08/06/2010  . Cough 07/29/2010  . Conjunctivitis of both eyes 03/31/2010  . HYPERTRIGLYCERIDEMIA 12/20/2009  . IMPAIRED FASTING GLUCOSE 12/20/2009  . DEGENERATIVE JOINT DISEASE, RIGHT KNEE 10/31/2007  . DERMATOPHYTOSIS OF FOOT 09/12/2007  . ALLERGIC RHINITIS 09/12/2007  . Asthma, moderate persistent, well-controlled 09/12/2007    History reviewed. No pertinent surgical history.     Home Medications    Prior to Admission medications   Medication Sig Start Date End Date Taking? Authorizing Provider  albuterol (VENTOLIN HFA) 108 (90 Base) MCG/ACT inhaler Inhale 2 puffs into the lungs every 4 (four) hours as needed. 07/06/15  Yes Kathee Delton, MD  guaiFENesin (MUCINEX) 600 MG 12 hr tablet Take 1 tablet (600 mg total) by mouth 2 (two) times daily as needed for cough or to loosen phlegm. 02/14/14  Yes Narda Bonds, MD  acetaminophen (TYLENOL) 500 MG tablet  Take 1 tablet (500 mg total) by mouth every 6 (six) hours as needed. 02/14/14   Narda Bonds, MD  amoxicillin (AMOXIL) 500 MG capsule Take 1 capsule (500 mg total) by mouth 3 (three) times daily. 02/09/16   Charm Rings, MD  benzonatate (TESSALON) 100 MG capsule Take 1 capsule (100 mg total) by mouth every 8 (eight) hours. 02/09/16   Charm Rings, MD  predniSONE (DELTASONE) 50 MG tablet Take 1 pill daily for 5 days. 02/09/16   Charm Rings, MD    Family History No family history on file.  Social History Social History  Substance Use Topics  . Smoking status: Never Smoker  . Smokeless tobacco: Never Used  . Alcohol use Yes     Comment: occasionally     Allergies   Patient has no known allergies.   Review of Systems Review of Systems As in history of present illness  Physical Exam Triage Vital Signs ED Triage Vitals  Enc Vitals Group     BP 02/09/16 1750 123/79     Pulse Rate 02/09/16 1750 71     Resp 02/09/16 1750 16     Temp 02/09/16 1750 98.3 F (36.8 C)     Temp Source 02/09/16 1750 Oral     SpO2 02/09/16 1750 100 %     Weight --      Height --      Head Circumference --      Peak Flow --  Pain Score 02/09/16 1753 0     Pain Loc --      Pain Edu? --      Excl. in GC? --    No data found.   Updated Vital Signs BP 123/79   Pulse 71   Temp 98.3 F (36.8 C) (Oral)   Resp 16   SpO2 100%   Visual Acuity Right Eye Distance:   Left Eye Distance:   Bilateral Distance:    Right Eye Near:   Left Eye Near:    Bilateral Near:     Physical Exam  Constitutional: He is oriented to person, place, and time. He appears well-developed and well-nourished. No distress.  Neck: Neck supple.  Cardiovascular: Normal rate, regular rhythm and normal heart sounds.   No murmur heard. Pulmonary/Chest: He is in respiratory distress. He has wheezes (diffuse inspiratory and expiratory).  Neurological: He is alert and oriented to person, place, and time.     UC  Treatments / Results  Labs (all labs ordered are listed, but only abnormal results are displayed) Labs Reviewed - No data to display  EKG  EKG Interpretation None       Radiology Dg Chest 2 View  Result Date: 02/09/2016 CLINICAL DATA:  Cough, low-grade fever and chest congestion x2 weeks. EXAM: CHEST  2 VIEW COMPARISON:  01/09/2009 FINDINGS: Heart is normal in size. Aorta is not aneurysmal. Multiple pulmonary parenchymal calcifications are again seen bilaterally more so on the left. No pulmonary consolidation, effusion or pulmonary edema. Scarring at the upper lobes bilaterally left greater than right. Atelectasis at the left lung base. No focal bony abnormality. 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 Electronically Signed   By: Tollie Ethavid  Kwon M.D.   On: 02/09/2016 20:08    Procedures Procedures (including critical care time)  Medications Ordered in UC Medications  ipratropium-albuterol (DUONEB) 0.5-2.5 (3) MG/3ML nebulizer solution 3 mL (3 mLs Nebulization Given 02/09/16 1853)  methylPREDNISolone sodium succinate (SOLU-MEDROL) 125 mg/2 mL injection 125 mg (125 mg Intramuscular Given 02/09/16 1853)     Initial Impression / Assessment and Plan / UC Course  I have reviewed the triage vital signs and the nursing notes.  Pertinent labs & imaging results that were available during my care of the patient were reviewed by me and considered in my medical decision making (see chart for details).     Wheezing resolved after DuoNeb. He had persistent crackles in the left lung base. Chest x-ray shows stable granuloma calcifications. No acute disease. Will treat with amoxicillin, prednisone, and Tessalon as this worked well previously. Continue albuterol as needed. Follow-up as needed.  Final Clinical Impressions(s) / UC Diagnoses   Final diagnoses:  Bronchitis    New Prescriptions Current Discharge  Medication List       Charm RingsErin J Imraan Wendell, MD 02/09/16 2022

## 2016-02-09 NOTE — ED Triage Notes (Signed)
C/O cough, congestion, wheezing over past 2 wks.  Has been feeling feverish.  Has been using Nyquil, Mucinex, and albuterol without significant relief.

## 2016-02-09 NOTE — Discharge Instructions (Signed)
You have bronchitis. °Take amoxicillin and prednisone as prescribed. °Use tessalon as needed for cough. °Use the albuterol every 4 hours as needed for wheezing or cough. °You should see improvement in the next 3-5 days. °If you develop fevers, difficulty breathing, or are just not getting better, please come back or go to the emergency room. ° °

## 2016-11-07 ENCOUNTER — Encounter (HOSPITAL_COMMUNITY): Payer: Self-pay | Admitting: Emergency Medicine

## 2016-11-07 ENCOUNTER — Ambulatory Visit (HOSPITAL_COMMUNITY)
Admission: EM | Admit: 2016-11-07 | Discharge: 2016-11-07 | Disposition: A | Discharge status: Home / Self Care | Payer: Self-pay | Attending: Family Medicine | Admitting: Family Medicine

## 2016-11-07 DIAGNOSIS — R0602 Shortness of breath: Secondary | ICD-10-CM

## 2016-11-07 DIAGNOSIS — R062 Wheezing: Secondary | ICD-10-CM

## 2016-11-07 DIAGNOSIS — J45998 Other asthma: Secondary | ICD-10-CM

## 2016-11-07 DIAGNOSIS — J45901 Unspecified asthma with (acute) exacerbation: Secondary | ICD-10-CM

## 2016-11-07 MED ORDER — METHYLPREDNISOLONE SODIUM SUCC 125 MG IJ SOLR
80.0000 mg | Freq: Once | INTRAMUSCULAR | Status: AC
  Administered 2016-11-07: 80 mg via INTRAMUSCULAR

## 2016-11-07 MED ORDER — ALBUTEROL SULFATE (2.5 MG/3ML) 0.083% IN NEBU
INHALATION_SOLUTION | RESPIRATORY_TRACT | Status: AC
  Filled 2016-11-07: qty 3

## 2016-11-07 MED ORDER — METHYLPREDNISOLONE SODIUM SUCC 125 MG IJ SOLR
INTRAMUSCULAR | Status: AC
  Filled 2016-11-07: qty 2

## 2016-11-07 MED ORDER — ALBUTEROL SULFATE (2.5 MG/3ML) 0.083% IN NEBU
5.0000 mg | INHALATION_SOLUTION | Freq: Once | RESPIRATORY_TRACT | Status: AC
  Administered 2016-11-07: 5 mg via RESPIRATORY_TRACT

## 2016-11-07 MED ORDER — ALBUTEROL SULFATE HFA 108 (90 BASE) MCG/ACT IN AERS: 2.0000 | INHALATION_SPRAY | RESPIRATORY_TRACT | 3 refills | Status: DC | PRN

## 2016-11-07 NOTE — ED Triage Notes (Signed)
Wheezing for 10 days, chest congestion, and cough

## 2016-11-07 NOTE — ED Provider Notes (Signed)
[facility]    CSN: [id number] Arrival date & time: 11/07/16  1336     History   Chief Complaint Chief Complaint  Patient presents with  . Asthma    HPI Jon Bowen is a 56 y.o. male.   56 y.o. male presents with wheezing and SHOB. Patient states that he started with upper respiratory symptoms nasal congestion and sore throat  X 10 days . Condition is acute on chronic in nature. Condition is made better by nothing. Condition is made worse by nothing. Patient denies any treatment prior to there arrival at this facility. Patient states that he has a history of asthma and is out of his inhaler.       Past Medical History:  Diagnosis Date  . Asthma     Patient Active Problem List   Diagnosis Date Noted  . Finger injury 06/25/2014  . Atypical pneumonia 01/05/2014  . Asthma with acute exacerbation 12/26/2013  . Screening for prostate cancer 07/04/2013  . Colon cancer screening 07/04/2013  . Right knee pain 08/23/2012  . Right tennis elbow 12/02/2010  . 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 Gastrocnemius muscle tear 10/21/2010  . Ankle pain, left 08/06/2010  . Cough 07/29/2010  . Conjunctivitis of both eyes 03/31/2010  . HYPERTRIGLYCERIDEMIA 12/20/2009  . IMPAIRED FASTING GLUCOSE 12/20/2009  . DEGENERATIVE JOINT DISEASE, RIGHT KNEE 10/31/2007  . DERMATOPHYTOSIS OF FOOT 09/12/2007  . ALLERGIC RHINITIS 09/12/2007  . Asthma, moderate persistent, well-controlled 09/12/2007    History reviewed. No pertinent surgical history.     Home Medications    Prior to Admission medications   Medication Sig Start Date End Date Taking? Authorizing Provider  acetaminophen (TYLENOL) 500 MG tablet Take 1 tablet (500 mg total) by mouth every 6 (six) hours as needed. 02/14/14   Narda Bonds, MD  albuterol (VENTOLIN HFA) 108 (90 Base) MCG/ACT inhaler Inhale 2 puffs into the lungs every 4 (four) hours as needed. 11/07/16   Alene Mires, NP  amoxicillin  (AMOXIL) 500 MG capsule Take 1 capsule (500 mg total) by mouth 3 (three) times daily. 02/09/16   Charm Rings, MD  benzonatate (TESSALON) 100 MG capsule Take 1 capsule (100 mg total) by mouth every 8 (eight) hours. 02/09/16   Charm Rings, MD  guaiFENesin (MUCINEX) 600 MG 12 hr tablet Take 1 tablet (600 mg total) by mouth 2 (two) times daily as needed for cough or to loosen phlegm. 02/14/14   Narda Bonds, MD  predniSONE (DELTASONE) 50 MG tablet Take 1 pill daily for 5 days. 02/09/16   Charm Rings, MD    Family History No family history on file.  Social History Social History  Substance Use Topics  . Smoking status: Never Smoker  . Smokeless tobacco: Never Used  . Alcohol use Yes     Comment: occasionally     Allergies   Patient has no known allergies.   Review of Systems Review of Systems  Constitutional: Negative for chills and fever.  HENT: Negative for ear pain and sore throat.   Eyes: Negative for pain and visual disturbance.  Respiratory: Positive for shortness of breath and wheezing. Negative for cough.   Cardiovascular: Negative for chest pain and palpitations.  Gastrointestinal: Negative for abdominal pain and vomiting.  Genitourinary: Negative for dysuria and hematuria.  Musculoskeletal: Negative for arthralgias and back pain.  Skin: Negative for color change and rash.  Neurological: Negative for seizures and syncope.  All other systems reviewed and are  negative.    Physical Exam Triage Vital Signs ED Triage Vitals  Enc Vitals Group     BP 11/07/16 1430 124/66     Pulse Rate 11/07/16 1430 75     Resp 11/07/16 1430 (!) 24     Temp 11/07/16 1430 (!) 97.5 F (36.4 C)     Temp Source 11/07/16 1430 Oral     SpO2 11/07/16 1430 96 %     Weight --      Height --      Head Circumference --      Peak Flow --      Pain Score 11/07/16 1428 6     Pain Loc --      Pain Edu? --      Excl. in GC? --    No data found.   Updated Vital Signs BP 124/66 (BP  Location: Right Arm)   Pulse 75   Temp (!) 97.5 F (36.4 C) (Oral)   Resp (!) 24   SpO2 96%   Visual Acuity Right Eye Distance:   Left Eye Distance:   Bilateral Distance:    Right Eye Near:   Left Eye Near:    Bilateral Near:     Physical Exam  Constitutional: He is oriented to person, place, and time. He appears well-developed and well-nourished.  HENT:  Head: Normocephalic.  Neck: Normal range of motion.  Cardiovascular: Normal rate and regular rhythm.   Pulmonary/Chest: Effort normal. He has wheezes ( x 4 lobes).  Musculoskeletal: Normal range of motion.  Neurological: He is alert and oriented to person, place, and time.  Skin: Skin is dry.  Psychiatric: He has a normal mood and affect.  Nursing note and vitals reviewed.    UC Treatments / Results  Labs (all labs ordered are listed, but only abnormal results are displayed) Labs Reviewed - No data to display  EKG  EKG Interpretation None       Radiology No results found.  Procedures Procedures (including critical care time)  Medications Ordered in UC Medications  albuterol (PROVENTIL) (2.5 MG/3ML) 0.083% nebulizer solution 5 mg (5 mg Nebulization Given 11/07/16 1449)  methylPREDNISolone sodium succinate (SOLU-MEDROL) 125 mg/2 mL injection 80 mg (80 mg Intramuscular Given 11/07/16 1449)     Initial Impression / Assessment and Plan / UC Course  I have reviewed the triage vital signs and the nursing notes.  Pertinent labs & imaging results that were available during my care of the patient were reviewed by me and considered in my medical decision making (see chart for details).   Lobes clear after nebulizer treatment  Final Clinical Impressions(s) / UC Diagnoses   Final diagnoses:  Wheezing    New Prescriptions Current Discharge Medication List       Controlled Substance Prescriptions [place] Controlled Substance Registry consulted? Not Applicable   Alene MiresOmohundro, Lelynd Poer C, NP 11/07/16 1515

## 2017-02-12 ENCOUNTER — Ambulatory Visit (HOSPITAL_COMMUNITY)
Admission: EM | Admit: 2017-02-12 | Discharge: 2017-02-12 | Disposition: A | Discharge status: Home / Self Care | Payer: Self-pay | Attending: Family Medicine | Admitting: Family Medicine

## 2017-02-12 ENCOUNTER — Other Ambulatory Visit: Payer: Self-pay

## 2017-02-12 ENCOUNTER — Encounter (HOSPITAL_COMMUNITY): Payer: Self-pay | Admitting: Emergency Medicine

## 2017-02-12 DIAGNOSIS — J45998 Other asthma: Secondary | ICD-10-CM

## 2017-02-12 DIAGNOSIS — J4521 Mild intermittent asthma with (acute) exacerbation: Secondary | ICD-10-CM

## 2017-02-12 MED ORDER — PREDNISONE 10 MG (21) PO TBPK: ORAL_TABLET | Freq: Every day | ORAL | 1 refills | Status: DC

## 2017-02-12 MED ORDER — ALBUTEROL SULFATE HFA 108 (90 BASE) MCG/ACT IN AERS: 2.0000 | INHALATION_SPRAY | RESPIRATORY_TRACT | 3 refills | Status: DC | PRN

## 2017-02-12 NOTE — ED Provider Notes (Signed)
[facility]   [id number] 02/12/17 Arrival Time: 1825   SUBJECTIVE:  Jon Bowen is a 57 y.o. male who presents to the urgent care with complaint of asthma flair up, states he's been using his inhaler without relief. Pt states he was given a steroid last time he had asthma flair and it helped a lot.  Patient has had wheezing for 2-3 weeks.  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  He has had no fevers or chest pain.  Past Medical History:  Diagnosis Date  . Asthma    No family history on file. Social History   Socioeconomic History  . Marital status: Married    Spouse name: Not on file  . Number of children: Not on file  . Years of education: Not on file  . Highest education level: Not on file  Social Needs  . Financial resource strain: Not on file  . Food insecurity - worry: Not on file  . Food insecurity - inability: Not on file  . Transportation needs - medical: Not on file  . Transportation needs - non-medical: Not on file  Occupational History  . Not on file  Tobacco Use  . Smoking status: Never Smoker  . Smokeless tobacco: Never Used  Substance and Sexual Activity  . Alcohol use: Yes    Comment: occasionally  . Drug use: No  . Sexual activity: Not on file  Other Topics Concern  . Not on file  Social History Narrative  . Not on file   No outpatient medications have been marked as taking for the 02/12/17 encounter [facility](Hospital Encounter).   No Known Allergies    ROS: As per HPI, remainder of ROS negative.   OBJECTIVE:   Vitals:   02/12/17 1853  BP: 125/82  Pulse: 74  Resp: 18  Temp: 98 F (36.7 C)  SpO2: 97%     General appearance: alert; no distress Eyes: PERRL; EOMI; conjunctiva normal HENT: normocephalic; atraumatic; TMs normal, canal normal, external ears normal without trauma; nasal mucosa normal; oral mucosa normal Neck: supple Lungs: bilateral expiratory wheezes. Heart: regular rate and rhythm Back: no CVA  tenderness Extremities: no cyanosis or edema; symmetrical with no gross deformities Skin: warm and dry Neurologic: normal gait; grossly normal Psychological: alert and cooperative; normal mood and affect      Labs:  Results for orders placed or performed in visit on 07/04/13  PSA  Result Value Ref Range   PSA 1.85 <=4.00 ng/mL  Lipid Panel  Result Value Ref Range   Cholesterol 226 (H) 0 - 200 mg/dL   Triglycerides 79 <147<150 mg/dL   HDL 52 >82>39 mg/dL   Total CHOL/HDL Ratio 4.3 Ratio   VLDL 16 0 - 40 mg/dL   LDL Cholesterol 956158 (H) 0 - 99 mg/dL  Comprehensive metabolic panel  Result Value Ref Range   Sodium 137 135 - 145 mEq/L   Potassium 3.9 3.5 - 5.3 mEq/L   Chloride 104 96 - 112 mEq/L   CO2 26 19 - 32 mEq/L   Glucose, Bld 87 70 - 99 mg/dL   BUN 9 6 - 23 mg/dL   Creat 2.130.82 0.860.50 - 5.781.35 mg/dL   Total Bilirubin 0.7 0.2 - 1.2 mg/dL   Alkaline Phosphatase 65 39 - 117 U/L   AST 32 0 - 37 U/L   ALT 31 0 - 53 U/L   Total Protein 7.0 6.0 - 8.3 g/dL   Albumin 4.1 3.5 - 5.2 g/dL  Calcium 9.0 8.4 - 10.5 mg/dL  POC Hemoccult Bld/Stl (3-Cd Home Screen)  Result Value Ref Range   Card #1 Date 07-17-13    Fecal Occult Blood, POC Negative    Card #2 Date 07-18-13    Card #2 Fecal Occult Blod, POC Negative    Card #3 Date 07-19-13    Card #3 Fecal Occult Blood, POC Negative     Labs Reviewed - No data to display  No results found.     ASSESSMENT & PLAN:  1. Mild intermittent asthma with exacerbation   2. Asthma, persistent controlled     Meds ordered this encounter  Medications  . predniSONE (STERAPRED UNI-PAK 21 TAB) 10 MG (21) TBPK tablet    Sig: Take by mouth daily. Take 6 tabs by mouth daily  for 2 days, then 5 tabs for 2 days, then 4 tabs for 2 days, then 3 tabs for 2 days, 2 tabs for 2 days, then 1 tab by mouth daily for 2 days    Dispense:  42 tablet    Refill:  1  . albuterol (VENTOLIN HFA) 108 (90 Base) MCG/ACT inhaler    Sig: Inhale 2 puffs into the lungs every  4 (four) hours as needed.    Dispense:  3 Inhaler    Refill:  3    Reviewed expectations re: course of current medical issues. Questions answered. Outlined signs and symptoms indicating need for more acute intervention. Patient verbalized understanding. After Visit Summary given.    Procedures:      Elvina Sidle, MD 02/12/17 1921

## 2017-02-12 NOTE — ED Triage Notes (Signed)
Pt c/o asthma flair up, states hes been using his inhaler without relief. Pt states he was given a steroid last time he had issues with it and it helped a lot.

## 2017-03-10 ENCOUNTER — Encounter (HOSPITAL_COMMUNITY): Payer: Self-pay | Admitting: Emergency Medicine

## 2017-03-10 ENCOUNTER — Ambulatory Visit (HOSPITAL_COMMUNITY)
Admission: EM | Admit: 2017-03-10 | Discharge: 2017-03-10 | Disposition: A | Discharge status: Home / Self Care | Payer: Self-pay | Attending: Family Medicine | Admitting: Family Medicine

## 2017-03-10 ENCOUNTER — Other Ambulatory Visit: Payer: Self-pay

## 2017-03-10 DIAGNOSIS — J4521 Mild intermittent asthma with (acute) exacerbation: Secondary | ICD-10-CM

## 2017-03-10 MED ORDER — PREDNISONE 10 MG (21) PO TBPK: ORAL_TABLET | Freq: Every day | ORAL | 0 refills | Status: DC

## 2017-03-10 NOTE — ED Triage Notes (Addendum)
Patient complains of cough, wheezing.  Onset of symptoms one week ago.  Joint aches and some sore throat Patient has similar episode one month ago.

## 2017-03-10 NOTE — ED Notes (Signed)
Patient discharged by provider.

## 2017-03-15 NOTE — ED Provider Notes (Signed)
  [facility]   [id number] 03/10/17 Arrival Time: 1747  ASSESSMENT & PLAN:  1. Mild intermittent asthma with acute exacerbation     Meds ordered this encounter  Medications  . predniSONE (STERAPRED UNI-PAK 21 TAB) 10 MG (21) TBPK tablet    Sig: Take by mouth daily. Take as directed.    Dispense:  21 tablet    Refill:  0   OTC symptom care as needed. May f/u with PCP or here if not seeing improvement in the next 24-48 hours.  Reviewed expectations re: course of current medical issues. Questions answered. Outlined signs and symptoms indicating need for more acute intervention. Patient verbalized understanding. After Visit Summary given.   SUBJECTIVE: History from: patient.  Jon Bowen is a 57 y.o. male who presents with complaint of wheezing and cough. Onset abrupt, approximately 1 week ago. SOB: none. Wheezing: moderate, esp with coughing. Fever: no. Overall normal PO intake without n/v. Sick contacts: no. OTC treatment: none. Occasionally used inhaler. Has at home.  Social History   Tobacco Use  Smoking Status Never Smoker  Smokeless Tobacco Never Used    ROS: As per HPI.   OBJECTIVE:  Vitals:   03/10/17 1846  BP: 110/75  Pulse: 82  Temp: (!) 97.5 F (36.4 C)  TempSrc: Oral  SpO2: 97%     General appearance: alert; appears fatigued HEENT: nasal congestion; clear runny nose; throat irritation secondary to post-nasal drainage Neck: supple without LAD Lungs: unlabored respirations, symmetrical air entry with mild expiratory wheezes throughout; cough: mild; no respiratory distress Skin: warm and dry Psychological: alert and cooperative; normal mood and affect  Imaging: No results found.  No Known Allergies  Past Medical History:  Diagnosis Date  . Asthma    No family history on file. Social History   Socioeconomic History  . Marital status: Married    Spouse name: Not on file  . Number of children: Not on file  . Years of  education: Not on file  . Highest education level: Not on file  Social Needs  . Financial resource strain: Not on file  . Food insecurity - worry: Not on file  . Food insecurity - inability: Not on file  . Transportation needs - medical: Not on file  . Transportation needs - non-medical: Not on file  Occupational History  . Not on file  Tobacco Use  . Smoking status: Never Smoker  . Smokeless tobacco: Never Used  Substance and Sexual Activity  . Alcohol use: Yes    Comment: occasionally  . Drug use: No  . Sexual activity: Not on file  Other Topics Concern  . Not on file  Social History Narrative  . Not on file           Mardella LaymanHagler, Shaman Muscarella, MD 03/15/17 [phone]

## 2017-04-14 ENCOUNTER — Encounter (HOSPITAL_COMMUNITY): Payer: Self-pay | Admitting: Emergency Medicine

## 2017-04-14 ENCOUNTER — Ambulatory Visit (HOSPITAL_COMMUNITY)
Admission: EM | Admit: 2017-04-14 | Discharge: 2017-04-14 | Disposition: A | Discharge status: Home / Self Care | Payer: Self-pay | Attending: Family Medicine | Admitting: Family Medicine

## 2017-04-14 DIAGNOSIS — J4521 Mild intermittent asthma with (acute) exacerbation: Secondary | ICD-10-CM

## 2017-04-14 MED ORDER — PREDNISONE 10 MG (21) PO TBPK: ORAL_TABLET | Freq: Every day | ORAL | 0 refills | Status: DC

## 2017-04-14 NOTE — ED Triage Notes (Signed)
Pt here for cough and URI sx  

## 2017-04-21 NOTE — ED Provider Notes (Signed)
[facility]   [id number] 04/14/17 Arrival Time: 1825  ASSESSMENT & PLAN:  1. Mild intermittent asthma with exacerbation    Nebulizer treatment needed: no.  Meds ordered this encounter  Medications  . predniSONE (STERAPRED UNI-PAK 21 TAB) 10 MG (21) TBPK tablet    Sig: Take by mouth daily. Take as directed.    Dispense:  21 tablet    Refill:  0   Asthma precautions given. OTC symptom care as needed. May f/u with PCP or here as needed.  Reviewed expectations re: course of current medical issues. Questions answered. Outlined signs and symptoms indicating need for more acute intervention. Patient verbalized understanding. After Visit Summary given.  SUBJECTIVE: History from: patient.  Jon Bowen is a 57 y.o. male who presents with complaint of intermittent non-productive cough and wheezing. Triggers: URI symptoms. Onset gradual, approximately several days ago. Describes wheezing as mild when present. Fever: no. Overall normal PO intake without n/v. Sick contacts: no. Typically his asthma is well controlled. No specific aggravating or alleviating factors reported. Inhaler use: occasional. OTC treatment: none.  Social History   Tobacco Use  Smoking Status Never Smoker  Smokeless Tobacco Never Used    ROS: As per HPI.   OBJECTIVE:  Vitals:   04/14/17 1926  BP: 113/78  Pulse: 78  Resp: 18  Temp: 98 F (36.7 C)  TempSrc: Oral  SpO2: 95%     General appearance: alert; appears fatigued HEENT: nasal congestion; clear runny nose; throat irritation secondary to post-nasal drainage Neck: supple without LAD Lungs: unlabored respirations, mild bilateral wheezing; cough: mild; no significant respiratory distress Skin: warm and dry Psychological: alert and cooperative; normal mood and affect  No Known Allergies  Past Medical History:  Diagnosis Date  . Asthma     Social History   Socioeconomic History  . Marital status: Married    Spouse name:  Not on file  . Number of children: Not on file  . Years of education: Not on file  . Highest education level: Not on file  Occupational History  . Not on file  Social Needs  . Financial resource strain: Not on file  . Food insecurity:    Worry: Not on file    Inability: Not on file  . Transportation needs:    Medical: Not on file    Non-medical: Not on file  Tobacco Use  . Smoking status: Never Smoker  . Smokeless tobacco: Never Used  Substance and Sexual Activity  . Alcohol use: Yes    Comment: occasionally  . Drug use: No  . Sexual activity: Not on file  Lifestyle  . Physical activity:    Days per week: Not on file    Minutes per session: Not on file  . Stress: Not on file  Relationships  . Social connections:    Talks on phone: Not on file    Gets together: Not on file    Attends religious service: Not on file    Active member of club or organization: Not on file    Attends meetings of clubs or organizations: Not on file    Relationship status: Not on file  . Intimate partner violence:    Fear of current or ex partner: Not on file    Emotionally abused: Not on file    Physically abused: Not on file    Forced sexual activity: Not on file  Other Topics Concern  . Not on file  Social History Narrative  . Not on  file       Mardella Layman, MD 04/21/17 [phone]

## 2017-04-28 ENCOUNTER — Emergency Department (HOSPITAL_COMMUNITY)
Admission: EM | Admit: 2017-04-28 | Discharge: 2017-04-29 | Disposition: A | Discharge status: Home / Self Care | Payer: Self-pay | Attending: Physician Assistant | Admitting: Physician Assistant

## 2017-04-28 ENCOUNTER — Emergency Department (HOSPITAL_COMMUNITY): Payer: Self-pay

## 2017-04-28 ENCOUNTER — Other Ambulatory Visit: Payer: Self-pay

## 2017-04-28 ENCOUNTER — Encounter (HOSPITAL_COMMUNITY): Payer: Self-pay | Admitting: *Deleted

## 2017-04-28 DIAGNOSIS — J45901 Unspecified asthma with (acute) exacerbation: Secondary | ICD-10-CM | POA: Insufficient documentation

## 2017-04-28 DIAGNOSIS — R0602 Shortness of breath: Secondary | ICD-10-CM

## 2017-04-28 MED ORDER — ALBUTEROL SULFATE (2.5 MG/3ML) 0.083% IN NEBU
5.0000 mg | INHALATION_SOLUTION | Freq: Once | RESPIRATORY_TRACT | Status: AC
  Administered 2017-04-28: 5 mg via RESPIRATORY_TRACT
  Filled 2017-04-28: qty 6

## 2017-04-28 MED ORDER — IPRATROPIUM-ALBUTEROL 0.5-2.5 (3) MG/3ML IN SOLN
3.0000 mL | Freq: Once | RESPIRATORY_TRACT | Status: AC
  Administered 2017-04-28: 3 mL via RESPIRATORY_TRACT

## 2017-04-28 MED ORDER — IPRATROPIUM-ALBUTEROL 0.5-2.5 (3) MG/3ML IN SOLN
3.0000 mL | Freq: Once | RESPIRATORY_TRACT | Status: AC
  Administered 2017-04-28: 3 mL via RESPIRATORY_TRACT
  Filled 2017-04-28: qty 3

## 2017-04-28 NOTE — ED Notes (Signed)
Patient transported to X-ray 

## 2017-04-28 NOTE — ED Provider Notes (Signed)
[facility] EMERGENCY DEPARTMENT Provider Note   CSN: [id number] Arrival date & time: 04/28/17  2130     History   Chief Complaint Chief Complaint  Patient presents with  . Shortness of Breath    HPI Jon Bowen is a 57 y.o. male with history of asthma who presents with a 1 day history of dry cough and shortness of breath and wheezing.  He has had associated chest tightness.  Patient reports he was seen 10 days ago at urgent care and was given prednisone.  He has completed this.  He has been using his at home inhaler without relief.  He denies any fever or abdominal pain.  He is not currently on any allergy medication.  Reports his symptoms are worse when he is outside working in the pollen.  HPI  Past Medical History:  Diagnosis Date  . Asthma     Patient Active Problem List   Diagnosis Date Noted  . Finger injury 06/25/2014  . Atypical pneumonia 01/05/2014  . Asthma with acute exacerbation 12/26/2013  . Screening for prostate cancer 07/04/2013  . Colon cancer screening 07/04/2013  . Right knee pain 08/23/2012  . Right tennis elbow 12/02/2010  . 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 Gastrocnemius muscle tear 10/21/2010  . Ankle pain, left 08/06/2010  . Cough 07/29/2010  . Conjunctivitis of both eyes 03/31/2010  . HYPERTRIGLYCERIDEMIA 12/20/2009  . IMPAIRED FASTING GLUCOSE 12/20/2009  . DEGENERATIVE JOINT DISEASE, RIGHT KNEE 10/31/2007  . DERMATOPHYTOSIS OF FOOT 09/12/2007  . ALLERGIC RHINITIS 09/12/2007  . Asthma, moderate persistent, well-controlled 09/12/2007    History reviewed. No pertinent surgical history.      Home Medications    Prior to Admission medications   Medication Sig Start Date End Date Taking? Authorizing Provider  acetaminophen (TYLENOL) 500 MG tablet Take 1 tablet (500 mg total) by mouth every 6 (six) hours as needed. 02/14/14   Narda Bonds, MD  albuterol (PROVENTIL HFA;VENTOLIN HFA) 108 (90 Base) MCG/ACT inhaler  Inhale 1-2 puffs into the lungs every 6 (six) hours as needed for wheezing or shortness of breath. 04/29/17   Daijon Wenke, Waylan Boga, PA-C  cetirizine (ZYRTEC ALLERGY) 10 MG tablet Take 1 tablet (10 mg total) by mouth daily. 04/29/17   Saamir Armstrong, Waylan Boga, PA-C  predniSONE (STERAPRED UNI-PAK 21 TAB) 10 MG (21) TBPK tablet Take by mouth daily. Take 6 tabs by mouth daily  for 2 days, then 5 tabs for 2 days, then 4 tabs for 2 days, then 3 tabs for 2 days, 2 tabs for 2 days, then 1 tab by mouth daily for 2 days 04/29/17   Emi Holes, PA-C    Family History No family history on file.  Social History Social History   Tobacco Use  . Smoking status: Never Smoker  . Smokeless tobacco: Never Used  Substance Use Topics  . Alcohol use: Yes    Comment: occasionally  . Drug use: No     Allergies   Patient has no known allergies.   Review of Systems Review of Systems  Constitutional: Negative for fever.  Respiratory: Positive for cough, chest tightness, shortness of breath and wheezing.   Cardiovascular: Negative for chest pain.  Gastrointestinal: Negative for abdominal pain.     Physical Exam Updated Vital Signs BP 127/89   Pulse 84   Temp (!) 97.2 F (36.2 C)   Resp 20   SpO2 93%   Physical Exam  Constitutional: He appears well-developed and well-nourished. No distress.  HENT:  Head: Normocephalic and atraumatic.  Mouth/Throat: Oropharynx is clear and moist. No oropharyngeal exudate.  Eyes: Pupils are equal, round, and reactive to light. Conjunctivae are normal. Right eye exhibits no discharge. Left eye exhibits no discharge. No scleral icterus.  Neck: Normal range of motion. Neck supple. No thyromegaly present.  Cardiovascular: Normal rate, regular rhythm, normal heart sounds and intact distal pulses. Exam reveals no gallop and no friction rub.  No murmur heard. Pulmonary/Chest: Effort normal. No stridor. No respiratory distress. He has wheezes (expiratory in the L lung after  albuterol neb in triage). He has no rales.  Abdominal: Soft. Bowel sounds are normal. He exhibits no distension. There is no tenderness. There is no rebound and no guarding.  Musculoskeletal: He exhibits no edema.  Lymphadenopathy:    He has no cervical adenopathy.  Neurological: He is alert. Coordination normal.  Skin: Skin is warm and dry. No rash noted. He is not diaphoretic. No pallor.  Psychiatric: He has a normal mood and affect.  Nursing note and vitals reviewed.    ED Treatments / Results  Labs (all labs ordered are listed, but only abnormal results are displayed) Labs Reviewed - No data to display  EKG None  EKG: normal EKG, normal sinus rhythm, rate 85, QRS 74.   Radiology Dg Chest 2 View  Result Date: 04/28/2017 CLINICAL DATA:  57 y/o M; shortness of breath with dry cough and some mid chest pain for 2-3 days. History of asthma. EXAM: CHEST - 2 VIEW COMPARISON:  02/09/2016 chest radiograph FINDINGS: Stable normal cardiac silhouette given projection and technique. Stable findings of chronic granulomatous disease. No new consolidation, effusion, or pneumothorax. Bones are unremarkable. IMPRESSION: Stable chronic findings of granulomatous disease. No acute pulmonary process identified. Electronically Signed   By: Mitzi HansenLance  Furusawa-Stratton M.D.   On: 04/28/2017 22:36    Procedures Procedures (including critical care time)  Medications Ordered in ED Medications  albuterol (PROVENTIL) (2.5 MG/3ML) 0.083% nebulizer solution 5 mg (5 mg Nebulization Given 04/28/17 2143)  ipratropium-albuterol (DUONEB) 0.5-2.5 (3) MG/3ML nebulizer solution 3 mL (3 mLs Nebulization Given 04/28/17 2240)  ipratropium-albuterol (DUONEB) 0.5-2.5 (3) MG/3ML nebulizer solution 3 mL (3 mLs Nebulization Given 04/28/17 2327)     Initial Impression / Assessment and Plan / ED Course  I have reviewed the triage vital signs and the nursing notes.  Pertinent labs & imaging results that were available during  my care of the patient were reviewed by me and considered in my medical decision making (see chart for details).  Clinical Course as of Apr 30 10  Wed Apr 28, 2017  2220 Patient reports he initially felt better after the albuterol nebulizer given in triage, however he feels like his symptoms are slowly returning.  Left lung expiratory wheezes on my exam.  Will give DuoNeb and reassess.   [AL]    Clinical Course User Index [AL] Emi HolesLaw, Jennings Corado M, PA-C    Patient ambulated in ED with O2 saturations maintained >95%, no current signs of respiratory distress.  CXR stable, no acute disease. Lung exam improved after 1 albuterol and 2 Duoneb nebulizer treatments. Prednisone given in the ED and pt will bd dc with taper considering patient rebounded after reported 5-6 day burst. Will also discharge home with Zyrtec as I feel patient's exacerbations are related to pollen at this time.  Pt states they are breathing at baseline. Pt has been instructed to continue using prescribed medications and to speak with PCP about today's exacerbation. Patient understands  and agrees with plan.  Patient vitals stable throughout ED course and discharged in satisfactory condition.   Final Clinical Impressions(s) / ED Diagnoses   Final diagnoses:  Exacerbation of asthma, unspecified asthma severity, unspecified whether persistent  Shortness of breath    ED Discharge Orders        Ordered    predniSONE (STERAPRED UNI-PAK 21 TAB) 10 MG (21) TBPK tablet  Daily     04/29/17 0005    albuterol (PROVENTIL HFA;VENTOLIN HFA) 108 (90 Base) MCG/ACT inhaler  Every 6 hours PRN     04/29/17 0005    cetirizine (ZYRTEC ALLERGY) 10 MG tablet  Daily     04/29/17 0005       Emi Holes, PA-C 04/29/17 0017    Abelino Derrick, MD 05/01/17 1301

## 2017-04-28 NOTE — ED Triage Notes (Signed)
Pt was seen at UC 10 days ago for asthma/sob, was given inhaler and prednisone. Pt has since finished prednisone and was feeling better until today. Wheezing noted in triage. Has had a productive cough and has been working outside more often in the afternoon

## 2017-04-29 MED ORDER — CETIRIZINE HCL 10 MG PO TABS: 10.0000 mg | ORAL_TABLET | Freq: Every day | ORAL | 0 refills | Status: DC

## 2017-04-29 MED ORDER — ALBUTEROL SULFATE HFA 108 (90 BASE) MCG/ACT IN AERS: 1.0000 | INHALATION_SPRAY | Freq: Four times a day (QID) | RESPIRATORY_TRACT | 0 refills | Status: DC | PRN

## 2017-04-29 MED ORDER — PREDNISONE 10 MG (21) PO TBPK: ORAL_TABLET | Freq: Every day | ORAL | 0 refills | Status: DC

## 2017-04-29 NOTE — ED Notes (Signed)
Pt ambulated on pulse ox. Maintained O2 sats @ 95% w/ HR of 91. Showing NAD. RR even and unlabored. Voices no questions/concerns @ this time.

## 2017-04-29 NOTE — Discharge Instructions (Signed)
Medications: Prednisone, Zyrtec, albuterol inhaler  Treatment: Take Prednisone until completed. Take Zyrtec daily for allergies and to help prevent exacerbation of your asthma symptoms. Use albuterol inhaler every 4-6 hours as needed for shortness of breath or wheezing.  Follow-up: Please return to the emergency department if you develop any new or worsening symptoms.

## 2017-10-30 ENCOUNTER — Ambulatory Visit (HOSPITAL_COMMUNITY)
Admission: EM | Admit: 2017-10-30 | Discharge: 2017-10-30 | Disposition: A | Discharge status: Home / Self Care | Payer: Self-pay | Attending: Physician Assistant | Admitting: Physician Assistant

## 2017-10-30 ENCOUNTER — Other Ambulatory Visit: Payer: Self-pay

## 2017-10-30 DIAGNOSIS — J4521 Mild intermittent asthma with (acute) exacerbation: Secondary | ICD-10-CM

## 2017-10-30 MED ORDER — MONTELUKAST SODIUM 10 MG PO TABS: 10.0000 mg | ORAL_TABLET | Freq: Every day | ORAL | 11 refills | Status: DC

## 2017-10-30 MED ORDER — FLUTICASONE PROPIONATE HFA 110 MCG/ACT IN AERO: 2.0000 | INHALATION_SPRAY | Freq: Two times a day (BID) | RESPIRATORY_TRACT | 12 refills | Status: DC

## 2017-10-30 MED ORDER — PREDNISONE 10 MG (21) PO TBPK: ORAL_TABLET | Freq: Every day | ORAL | 0 refills | Status: DC

## 2017-10-30 NOTE — ED Triage Notes (Signed)
Pt states his asthma has started to flare up x 2 week.

## 2017-10-30 NOTE — ED Provider Notes (Signed)
10/30/2017 2:32 PM   DOB: Jul 16, 1960 / MRN: [id number]  SUBJECTIVE:  Masashi Snowdon is a 57 y.o. male presenting for [facility] flair, feels well otherwise. Symtpoms started 10 days ago and worsening. No other concerns.    He has No Known Allergies.   He  has a past medical history of Asthma.    He  reports that he has never smoked. He has never used smokeless tobacco. He reports that he drinks alcohol. He reports that he does not use drugs. He  has no sexual activity history on file. The patient  has no past surgical history on file.  His family history is not on file.  Review of Systems  Constitutional: Negative for diaphoresis.  Respiratory: Positive for wheezing. Negative for cough, hemoptysis, sputum production and shortness of breath.   Cardiovascular: Negative for chest pain, orthopnea and leg swelling.  Gastrointestinal: Negative for nausea.  Neurological: Negative for dizziness.    OBJECTIVE:  BP 106/62 (BP Location: Right Arm)   Pulse 62   Temp 97.8 F (36.6 C) (Oral)   Resp 18   Wt 167 lb (75.8 kg)   SpO2 98%   BMI 28.67 kg/m   Wt Readings from Last 3 Encounters:  10/30/17 167 lb (75.8 kg)  06/20/14 162 lb (73.5 kg)  02/14/14 168 lb (76.2 kg)   Temp Readings from Last 3 Encounters:  10/30/17 97.8 F (36.6 C) (Oral)  04/29/17 98.2 F (36.8 C) (Oral)  04/14/17 98 F (36.7 C) (Oral)   BP Readings from Last 3 Encounters:  10/30/17 106/62  04/29/17 106/78  04/14/17 113/78   Pulse Readings from Last 3 Encounters:  10/30/17 62  04/29/17 82  04/14/17 78    Physical Exam  Constitutional: He is oriented to person, place, and time. He appears well-developed. He does not appear ill.  Eyes: Pupils are equal, round, and reactive to light. Conjunctivae and EOM are normal.  Cardiovascular: Normal rate.  Pulmonary/Chest: Effort normal. No stridor. No respiratory distress. He has wheezes. He has no rales. He exhibits no tenderness.  Abdominal: He exhibits no  distension.  Musculoskeletal: Normal range of motion.  Neurological: He is alert and oriented to person, place, and time. No cranial nerve deficit. Coordination normal.  Skin: Skin is warm and dry. He is not diaphoretic.  Psychiatric: He has a normal mood and affect.  Nursing note and vitals reviewed.   No results found for this or any previous visit (from the past 72 hour(s)).  No results found.  ASSESSMENT AND PLAN:   Mild intermittent asthma with exacerbation    Discharge Instructions     Fill the prednisone today.  You can try the singulair because I think that will be the cheapest.  Flovent is great if you can't control you asthma otherwise.         The patient is advised to call or return to clinic if he does not see an improvement in symptoms, or to seek the care of the closest emergency department if he worsens with the above plan.   Deliah Boston, MHS, PA-C 10/30/2017 2:32 PM   Ofilia Neas, PA-C 10/30/17 1433

## 2017-10-30 NOTE — Discharge Instructions (Signed)
Fill the prednisone today.  You can try the singulair because I think that will be the cheapest.  Flovent is great if you can't control you asthma otherwise.

## 2017-12-14 ENCOUNTER — Encounter (HOSPITAL_COMMUNITY): Payer: Self-pay | Admitting: Emergency Medicine

## 2017-12-14 ENCOUNTER — Ambulatory Visit (HOSPITAL_COMMUNITY)
Admission: EM | Admit: 2017-12-14 | Discharge: 2017-12-14 | Disposition: A | Discharge status: Home / Self Care | Payer: Self-pay | Attending: Family Medicine | Admitting: Family Medicine

## 2017-12-14 DIAGNOSIS — J4521 Mild intermittent asthma with (acute) exacerbation: Secondary | ICD-10-CM

## 2017-12-14 MED ORDER — PREDNISONE 50 MG PO TABS: 50.0000 mg | ORAL_TABLET | Freq: Every day | ORAL | 0 refills | Status: DC

## 2017-12-14 MED ORDER — IPRATROPIUM-ALBUTEROL 0.5-2.5 (3) MG/3ML IN SOLN
RESPIRATORY_TRACT | Status: AC
  Filled 2017-12-14: qty 3

## 2017-12-14 MED ORDER — IPRATROPIUM-ALBUTEROL 0.5-2.5 (3) MG/3ML IN SOLN
3.0000 mL | Freq: Once | RESPIRATORY_TRACT | Status: AC
  Administered 2017-12-14: 3 mL via RESPIRATORY_TRACT

## 2017-12-14 MED ORDER — ALBUTEROL SULFATE HFA 108 (90 BASE) MCG/ACT IN AERS: 1.0000 | INHALATION_SPRAY | Freq: Four times a day (QID) | RESPIRATORY_TRACT | 0 refills | Status: DC | PRN

## 2017-12-14 MED ORDER — IPRATROPIUM-ALBUTEROL 0.5-2.5 (3) MG/3ML IN SOLN
RESPIRATORY_TRACT | Status: AC
Start: 2017-12-14 — End: ?
  Filled 2017-12-14: qty 3

## 2017-12-14 NOTE — Discharge Instructions (Signed)
Start prednisone as directed. Continue albuterol inhaler as needed for shortness of breath and wheezing. Monitor for worsening of symptoms, increased shortness of breath/wheezing, fever, chest pain, follow up for reevaluation. Otherwise, follow up with PCP for further maintenance of asthma.

## 2017-12-14 NOTE — ED Triage Notes (Signed)
Pt here for asthma sx increased x 4 days

## 2017-12-14 NOTE — ED Provider Notes (Signed)
[facility]    CSN: [id number] Arrival date & time: 12/14/17  1740     History   Chief Complaint Chief Complaint  Patient presents with  . Asthma    HPI Jon Bowen is a 57 y.o. male.   57 year old male comes in for asthma exacerbation for the past 4 days.  Has had increased nonproductive cough with wheezing and shortness of breath at nighttime.  Denies rhinorrhea, nasal congestion.  Denies fever, chills, night sweats.  Has been using albuterol without relief.  Never smoker.     Past Medical History:  Diagnosis Date  . Asthma     Patient Active Problem List   Diagnosis Date Noted  . Finger injury 06/25/2014  . Atypical pneumonia 01/05/2014  . Asthma with acute exacerbation 12/26/2013  . Screening for prostate cancer 07/04/2013  . Colon cancer screening 07/04/2013  . Right knee pain 08/23/2012  . Right tennis elbow 12/02/2010  . 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 Gastrocnemius muscle tear 10/21/2010  . Ankle pain, left 08/06/2010  . Cough 07/29/2010  . Conjunctivitis of both eyes 03/31/2010  . HYPERTRIGLYCERIDEMIA 12/20/2009  . IMPAIRED FASTING GLUCOSE 12/20/2009  . DEGENERATIVE JOINT DISEASE, RIGHT KNEE 10/31/2007  . DERMATOPHYTOSIS OF FOOT 09/12/2007  . ALLERGIC RHINITIS 09/12/2007  . Asthma, moderate persistent, well-controlled 09/12/2007    History reviewed. No pertinent surgical history.     Home Medications    Prior to Admission medications   Medication Sig Start Date End Date Taking? Authorizing Provider  acetaminophen (TYLENOL) 500 MG tablet Take 1 tablet (500 mg total) by mouth every 6 (six) hours as needed. 02/14/14   Narda BondsNettey, Ralph A, MD  albuterol (PROVENTIL HFA;VENTOLIN HFA) 108 (90 Base) MCG/ACT inhaler Inhale 1-2 puffs into the lungs every 6 (six) hours as needed for wheezing or shortness of breath. 12/14/17   Cathie HoopsYu, Razia Screws V, PA-C  cetirizine (ZYRTEC ALLERGY) 10 MG tablet Take 1 tablet (10 mg total) by mouth daily. 04/29/17   Law,  Waylan BogaAlexandra M, PA-C  fluticasone (FLOVENT HFA) 110 MCG/ACT inhaler Inhale 2 puffs into the lungs 2 (two) times daily. 10/30/17   Ofilia Neaslark, Michael L, PA-C  montelukast (SINGULAIR) 10 MG tablet Take 1 tablet (10 mg total) by mouth at bedtime. 10/30/17   Ofilia Neaslark, Michael L, PA-C  predniSONE (DELTASONE) 50 MG tablet Take 1 tablet (50 mg total) by mouth daily. 12/14/17   Belinda FisherYu, Ralphie Lovelady V, PA-C    Family History History reviewed. No pertinent family history.  Social History Social History   Tobacco Use  . Smoking status: Never Smoker  . Smokeless tobacco: Never Used  Substance Use Topics  . Alcohol use: Yes    Comment: occasionally  . Drug use: No     Allergies   Patient has no known allergies.   Review of Systems Review of Systems  Reason unable to perform ROS: See HPI as above.     Physical Exam Triage Vital Signs ED Triage Vitals  Enc Vitals Group     BP 12/14/17 1843 128/77     Pulse Rate 12/14/17 1843 87     Resp 12/14/17 1843 18     Temp 12/14/17 1843 97.7 F (36.5 C)     Temp Source 12/14/17 1843 Oral     SpO2 12/14/17 1843 95 %     Weight --      Height --      Head Circumference --      Peak Flow --      Pain  Score 12/14/17 1842 3     Pain Loc --      Pain Edu? --      Excl. in GC? --    No data found.  Updated Vital Signs BP 128/77 (BP Location: Right Arm)   Pulse 87   Temp 97.7 F (36.5 C) (Oral)   Resp 18   SpO2 95%   Physical Exam  Constitutional: He is oriented to person, place, and time. He appears well-developed and well-nourished. No distress.  HENT:  Head: Normocephalic and atraumatic.  Right Ear: Tympanic membrane, external ear and ear canal normal. Tympanic membrane is not erythematous and not bulging.  Left Ear: Tympanic membrane, external ear and ear canal normal. Tympanic membrane is not erythematous and not bulging.  Nose: Nose normal. Right sinus exhibits no maxillary sinus tenderness and no frontal sinus tenderness. Left sinus exhibits no  maxillary sinus tenderness and no frontal sinus tenderness.  Mouth/Throat: Uvula is midline, oropharynx is clear and moist and mucous membranes are normal.  Eyes: Pupils are equal, round, and reactive to light. Conjunctivae are normal.  Neck: Normal range of motion. Neck supple.  Cardiovascular: Normal rate, regular rhythm and normal heart sounds. Exam reveals no gallop and no friction rub.  No murmur heard. Pulmonary/Chest: Effort normal. No accessory muscle usage. No respiratory distress.  Speaking in full sentences.  Diffuse inspiratory and expiratory wheezing, decreased air movement.  Lymphadenopathy:    He has no cervical adenopathy.  Neurological: He is alert and oriented to person, place, and time.  Skin: Skin is warm and dry.  Psychiatric: He has a normal mood and affect. His behavior is normal. Judgment normal.     UC Treatments / Results  Labs (all labs ordered are listed, but only abnormal results are displayed) Labs Reviewed - No data to display  EKG None  Radiology No results found.  Procedures Procedures (including critical care time)  Medications Ordered in UC Medications  ipratropium-albuterol (DUONEB) 0.5-2.5 (3) MG/3ML nebulizer solution 3 mL (3 mLs Nebulization Given 12/14/17 1902)  ipratropium-albuterol (DUONEB) 0.5-2.5 (3) MG/3ML nebulizer solution 3 mL (3 mLs Nebulization Given 12/14/17 1937)    Initial Impression / Assessment and Plan / UC Course  I have reviewed the triage vital signs and the nursing notes.  Pertinent labs & imaging results that were available during my care of the patient were reviewed by me and considered in my medical decision making (see chart for details).    Patient with much improved symptoms after DuoNeb x1.  Much improved air movement, still with residual expiratory wheezing to the upper fields bilaterally.  Will provide DuoNeb x2, and discharged with prednisone.  Offered Solu-Medrol injection in office today, patient  declined.  Return precautions given.  Final Clinical Impressions(s) / UC Diagnoses   Final diagnoses:  Mild intermittent asthma with exacerbation   ED Prescriptions    Medication Sig Dispense Auth. Provider   predniSONE (DELTASONE) 50 MG tablet Take 1 tablet (50 mg total) by mouth daily. 5 tablet Kaily Wragg V, PA-C   albuterol (PROVENTIL HFA;VENTOLIN HFA) 108 (90 Base) MCG/ACT inhaler Inhale 1-2 puffs into the lungs every 6 (six) hours as needed for wheezing or shortness of breath. 1 Inhaler Threasa Alpha, [place] 12/14/17 [id number]

## 2018-02-15 ENCOUNTER — Encounter (HOSPITAL_COMMUNITY): Payer: Self-pay | Admitting: Emergency Medicine

## 2018-02-15 ENCOUNTER — Ambulatory Visit (HOSPITAL_COMMUNITY)
Admission: EM | Admit: 2018-02-15 | Discharge: 2018-02-15 | Disposition: A | Discharge status: Home / Self Care | Payer: 59 | Attending: Family Medicine | Admitting: Family Medicine

## 2018-02-15 DIAGNOSIS — J4521 Mild intermittent asthma with (acute) exacerbation: Secondary | ICD-10-CM | POA: Diagnosis not present

## 2018-02-15 MED ORDER — PREDNISONE 50 MG PO TABS: 50.0000 mg | ORAL_TABLET | Freq: Every day | ORAL | 0 refills | Status: DC

## 2018-02-15 NOTE — ED Triage Notes (Signed)
Pt here for asthma sx; pt seen here for same in past

## 2018-02-15 NOTE — ED Notes (Signed)
Patient called x3 with no response °

## 2018-02-16 NOTE — ED Provider Notes (Signed)
[facility]   [id number] 02/15/18 Arrival Time: 1641  ASSESSMENT & PLAN:  1. Mild intermittent asthma with exacerbation    No hypoxia or respiratory distress. No indication for chest imaging or hospital evaluation at this time. Has responded well to prednisone in the past.  Meds ordered this encounter  Medications  . predniSONE (DELTASONE) 50 MG tablet    Sig: Take 1 tablet (50 mg total) by mouth daily.    Dispense:  5 tablet    Refill:  0   Asthma precautions given. OTC symptom care as needed.  Follow-up Information    [facility].   Specialty:  Urgent Care Why:  If symptoms worsen. Contact information: [address] [place] [address] [phone]         Stressed importance of est care with PCP to discuss asthma maintenance given his frequent exacerbations. Has inhaler.  Reviewed expectations re: course of current medical issues. Questions answered. Outlined signs and symptoms indicating need for more acute intervention. Patient verbalized understanding. After Visit Summary given.  SUBJECTIVE: History from: patient.  Jon Bowen is a 58 y.o. male who presents with complaint of intermittent chest tightness and wheezing. Triggers: none identified; questions winter weather. Onset gradual, over the past 1-2 weeks; sligtly worse over the past few days. Describes wheezing as mild to moderate when present. Fever: no. Overall normal PO intake without n/v. Sick contacts: no. No chest pain. Typically his asthma is not well controlled. OTC treatment: none.   Social History   Tobacco Use  Smoking Status Never Smoker  Smokeless Tobacco Never Used    ROS: As per HPI. All other systems negative    OBJECTIVE:  Vitals:   02/15/18 1714  BP: 112/81  Pulse: 89  Resp: 20  Temp: 97.8 F (36.6 C)  TempSrc: Oral  SpO2: 96%    General appearance: alert; no distress HEENT: nasal congestion; clear  runny nose; throat irritation secondary to post-nasal drainage Neck: supple without LAD Cv: RRR without murmer Lungs: unlabored respirations, moderate bilateral expiratory wheezing; cough: absent; no significant respiratory distress; able to speak in full sentences Skin: warm and dry Psychological: alert and cooperative; normal mood and affect  No Known Allergies  Past Medical History:  Diagnosis Date  . Asthma     Social History   Socioeconomic History  . Marital status: Married    Spouse name: Not on file  . Number of children: Not on file  . Years of education: Not on file  . Highest education level: Not on file  Occupational History  . Not on file  Social Needs  . Financial resource strain: Not on file  . Food insecurity:    Worry: Not on file    Inability: Not on file  . Transportation needs:    Medical: Not on file    Non-medical: Not on file  Tobacco Use  . Smoking status: Never Smoker  . Smokeless tobacco: Never Used  Substance and Sexual Activity  . Alcohol use: Yes    Comment: occasionally  . Drug use: No  . Sexual activity: Not on file  Lifestyle  . Physical activity:    Days per week: Not on file    Minutes per session: Not on file  . Stress: Not on file  Relationships  . Social connections:    Talks on phone: Not on file    Gets together: Not on file    Attends religious service: Not on  file    Active member of club or organization: Not on file    Attends meetings of clubs or organizations: Not on file    Relationship status: Not on file  . Intimate partner violence:    Fear of current or ex partner: Not on file    Emotionally abused: Not on file    Physically abused: Not on file    Forced sexual activity: Not on file  Other Topics Concern  . Not on file  Social History Narrative  . Not on file            Mardella Layman, MD 02/16/18 [phone]

## 2018-02-24 ENCOUNTER — Encounter (HOSPITAL_COMMUNITY): Payer: Self-pay | Admitting: Emergency Medicine

## 2018-02-24 ENCOUNTER — Ambulatory Visit (HOSPITAL_COMMUNITY)
Admission: EM | Admit: 2018-02-24 | Discharge: 2018-02-24 | Disposition: A | Discharge status: Home / Self Care | Payer: 59 | Attending: Internal Medicine | Admitting: Internal Medicine

## 2018-02-24 DIAGNOSIS — J4540 Moderate persistent asthma, uncomplicated: Secondary | ICD-10-CM

## 2018-02-24 MED ORDER — IPRATROPIUM-ALBUTEROL 0.5-2.5 (3) MG/3ML IN SOLN
RESPIRATORY_TRACT | Status: AC
  Filled 2018-02-24: qty 3

## 2018-02-24 MED ORDER — ALBUTEROL SULFATE HFA 108 (90 BASE) MCG/ACT IN AERS: 1.0000 | INHALATION_SPRAY | Freq: Four times a day (QID) | RESPIRATORY_TRACT | 0 refills | Status: AC | PRN

## 2018-02-24 MED ORDER — IPRATROPIUM-ALBUTEROL 0.5-2.5 (3) MG/3ML IN SOLN
3.0000 mL | Freq: Once | RESPIRATORY_TRACT | Status: AC
  Administered 2018-02-24: 3 mL via RESPIRATORY_TRACT

## 2018-02-24 MED ORDER — FLUTICASONE-SALMETEROL 250-50 MCG/DOSE IN AEPB: 1.0000 | INHALATION_SPRAY | Freq: Two times a day (BID) | RESPIRATORY_TRACT | 1 refills | Status: DC

## 2018-02-24 NOTE — ED Triage Notes (Signed)
Pt c/o asthma symptoms, seen on the 28th for the same. C/o wheezing. Started last night.

## 2018-10-26 NOTE — ED Provider Notes (Signed)
[facility]    CSN: [id number] Arrival date & time: 02/24/18  1725      History   Chief Complaint Chief Complaint  Patient presents with  . Asthma    HPI Jon Bowen is a 58 y.o. male.   58 yo male c/o recurrent asthma symptoms. Inhalers not helping.     Past Medical History:  Diagnosis Date  . Asthma     Patient Active Problem List   Diagnosis Date Noted  . Finger injury 06/25/2014  . Atypical pneumonia 01/05/2014  . Asthma with acute exacerbation 12/26/2013  . Screening for prostate cancer 07/04/2013  . Colon cancer screening 07/04/2013  . Right knee pain 08/23/2012  . Right tennis elbow 12/02/2010  . 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 Gastrocnemius muscle tear 10/21/2010  . Ankle pain, left 08/06/2010  . Cough 07/29/2010  . Conjunctivitis of both eyes 03/31/2010  . HYPERTRIGLYCERIDEMIA 12/20/2009  . IMPAIRED FASTING GLUCOSE 12/20/2009  . DEGENERATIVE JOINT DISEASE, RIGHT KNEE 10/31/2007  . DERMATOPHYTOSIS OF FOOT 09/12/2007  . ALLERGIC RHINITIS 09/12/2007  . Asthma, moderate persistent, well-controlled 09/12/2007    History reviewed. No pertinent surgical history.     Home Medications    Prior to Admission medications   Medication Sig Start Date End Date Taking? Authorizing Provider  acetaminophen (TYLENOL) 500 MG tablet Take 1 tablet (500 mg total) by mouth every 6 (six) hours as needed. 02/14/14   Narda BondsNettey, Ralph A, MD  albuterol (PROVENTIL HFA;VENTOLIN HFA) 108 (90 Base) MCG/ACT inhaler Inhale 1-2 puffs into the lungs every 6 (six) hours as needed for wheezing or shortness of breath. 02/24/18   Arnaldo Nataliamond, Deyci Gesell S, MD  cetirizine (ZYRTEC ALLERGY) 10 MG tablet Take 1 tablet (10 mg total) by mouth daily. Patient not taking: Reported on 02/24/2018 04/29/17   Emi HolesLaw, Alexandra M, PA-C  fluticasone (FLOVENT HFA) 110 MCG/ACT inhaler Inhale 2 puffs into the lungs 2 (two) times daily. Patient not taking: Reported on 02/24/2018 10/30/17   Ofilia Neaslark,  Analycia Khokhar L, PA-C  Fluticasone-Salmeterol (ADVAIR DISKUS) 250-50 MCG/DOSE AEPB Inhale 1 puff into the lungs 2 (two) times daily. 02/24/18   Arnaldo Nataliamond, Karstyn Birkey S, MD  montelukast (SINGULAIR) 10 MG tablet Take 1 tablet (10 mg total) by mouth at bedtime. Patient not taking: Reported on 02/24/2018 10/30/17   Ofilia Neaslark, Katesha Eichel L, PA-C  predniSONE (DELTASONE) 50 MG tablet Take 1 tablet (50 mg total) by mouth daily. Patient not taking: Reported on 02/24/2018 02/15/18   Mardella LaymanHagler, Brian, MD    Family History No family history on file.  Social History Social History   Tobacco Use  . Smoking status: Never Smoker  . Smokeless tobacco: Never Used  Substance Use Topics  . Alcohol use: Yes    Comment: occasionally  . Drug use: No     Allergies   Patient has no known allergies.   Review of Systems Review of Systems  Constitutional: Negative for chills and fever.  HENT: Negative for sore throat and tinnitus.   Eyes: Negative for redness.  Respiratory: Positive for cough, shortness of breath and wheezing.   Cardiovascular: Negative for chest pain and palpitations.  Gastrointestinal: Negative for abdominal pain, diarrhea, nausea and vomiting.  Genitourinary: Negative for dysuria, frequency and urgency.  Musculoskeletal: Negative for myalgias.  Skin: Negative for rash.       No lesions  Neurological: Negative for weakness.  Hematological: Does not bruise/bleed easily.  Psychiatric/Behavioral: Negative for suicidal ideas.     Physical Exam Triage Vital Signs ED Triage Vitals  Enc Vitals Group     BP 02/24/18 1800 (!) 129/105     Pulse Rate 02/24/18 1800 87     Resp 02/24/18 1800 18     Temp 02/24/18 1800 97.8 F (36.6 C)     Temp src --      SpO2 02/24/18 1800 97 %     Weight --      Height --      Head Circumference --      Peak Flow --      Pain Score 02/24/18 1759 0     Pain Loc --      Pain Edu? --      Excl. in GC? --    No data found.  Updated Vital Signs BP (!) 129/105   Pulse  87   Temp 97.8 F (36.6 C)   Resp 18   SpO2 97%   Visual Acuity Right Eye Distance:   Left Eye Distance:   Bilateral Distance:    Right Eye Near:   Left Eye Near:    Bilateral Near:     Physical Exam Constitutional:      General: He is not in acute distress.    Appearance: He is well-developed.  HENT:     Head: Normocephalic and atraumatic.  Eyes:     General: No scleral icterus.    Conjunctiva/sclera: Conjunctivae normal.     Pupils: Pupils are equal, round, and reactive to light.  Neck:     Musculoskeletal: Normal range of motion and neck supple.     Thyroid: No thyromegaly.     Vascular: No JVD.     Trachea: No tracheal deviation.  Cardiovascular:     Rate and Rhythm: Normal rate and regular rhythm.     Heart sounds: Normal heart sounds. No murmur. No friction rub. No gallop.   Pulmonary:     Effort: Pulmonary effort is normal. No respiratory distress.     Breath sounds: Wheezing present.  Abdominal:     General: Bowel sounds are normal. There is no distension.     Palpations: Abdomen is soft.     Tenderness: There is no abdominal tenderness.  Musculoskeletal: Normal range of motion.  Lymphadenopathy:     Cervical: No cervical adenopathy.  Skin:    General: Skin is warm and dry.     Findings: No erythema or rash.  Neurological:     Mental Status: He is alert and oriented to person, place, and time.     Cranial Nerves: No cranial nerve deficit.  Psychiatric:        Behavior: Behavior normal.        Thought Content: Thought content normal.        Judgment: Judgment normal.      UC Treatments / Results  Labs (all labs ordered are listed, but only abnormal results are displayed) Labs Reviewed - No data to display  EKG   Radiology No results found.  Procedures Procedures (including critical care time)  Medications Ordered in UC Medications  ipratropium-albuterol (DUONEB) 0.5-2.5 (3) MG/3ML nebulizer solution 3 mL (3 mLs Nebulization Given 02/24/18  1819)  ipratropium-albuterol (DUONEB) 0.5-2.5 (3) MG/3ML nebulizer solution (has no administration in time range)    Initial Impression / Assessment and Plan / UC Course  I have reviewed the triage vital signs and the nursing notes.  Pertinent labs & imaging results that were available during my care of the patient were reviewed by me and considered in my medical decision  making (see chart for details).     O2 sats good; WOB improved. Add ICS + LABA  Final Clinical Impressions(s) / UC Diagnoses   Final diagnoses:  Moderate persistent asthma without complication   Discharge Instructions   None    ED Prescriptions    Medication Sig Dispense Auth. Provider   Fluticasone-Salmeterol (ADVAIR DISKUS) 250-50 MCG/DOSE AEPB Inhale 1 puff into the lungs 2 (two) times daily. 60 each Harrie Foreman, MD   albuterol (PROVENTIL HFA;VENTOLIN HFA) 108 (90 Base) MCG/ACT inhaler Inhale 1-2 puffs into the lungs every 6 (six) hours as needed for wheezing or shortness of breath. 1 Inhaler Harrie Foreman, MD     PDMP not reviewed this encounter.   Harrie Foreman, MD 10/26/18 2224

## 2019-01-04 ENCOUNTER — Encounter (HOSPITAL_COMMUNITY): Payer: Self-pay

## 2019-01-04 ENCOUNTER — Other Ambulatory Visit: Payer: Self-pay

## 2019-01-04 ENCOUNTER — Emergency Department (HOSPITAL_COMMUNITY): Payer: 59

## 2019-01-04 DIAGNOSIS — Z79899 Other long term (current) drug therapy: Secondary | ICD-10-CM | POA: Diagnosis not present

## 2019-01-04 DIAGNOSIS — J45901 Unspecified asthma with (acute) exacerbation: Secondary | ICD-10-CM | POA: Insufficient documentation

## 2019-01-04 DIAGNOSIS — R062 Wheezing: Secondary | ICD-10-CM | POA: Diagnosis present

## 2019-01-04 NOTE — ED Triage Notes (Addendum)
Pt coming from home c/o shortness of breath x2 weeks. Hx of inhaler. Pt reports that he has been having to use his inhaler more frequently. Pt needs inhaler and requesting chest xray. Denies covid exposure. Wheezing

## 2019-01-05 ENCOUNTER — Emergency Department (HOSPITAL_COMMUNITY)
Admission: EM | Admit: 2019-01-05 | Discharge: 2019-01-05 | Disposition: A | Discharge status: Home / Self Care | Payer: 59 | Attending: Emergency Medicine | Admitting: Emergency Medicine

## 2019-01-05 DIAGNOSIS — J45901 Unspecified asthma with (acute) exacerbation: Secondary | ICD-10-CM

## 2019-01-05 MED ORDER — PREDNISONE 20 MG PO TABS: 40.0000 mg | ORAL_TABLET | Freq: Every day | ORAL | 0 refills | Status: DC

## 2019-01-05 MED ORDER — AEROCHAMBER Z-STAT PLUS/MEDIUM MISC
1.0000 | Freq: Once | Status: AC
  Administered 2019-01-05: 02:00:00 1
  Filled 2019-01-05: qty 1

## 2019-01-05 MED ORDER — PREDNISONE 20 MG PO TABS
60.0000 mg | ORAL_TABLET | Freq: Once | ORAL | Status: AC
  Administered 2019-01-05: 60 mg via ORAL
  Filled 2019-01-05: qty 3

## 2019-01-05 MED ORDER — ALBUTEROL SULFATE HFA 108 (90 BASE) MCG/ACT IN AERS
8.0000 | INHALATION_SPRAY | Freq: Once | RESPIRATORY_TRACT | Status: AC
  Administered 2019-01-05: 02:00:00 8 via RESPIRATORY_TRACT
  Filled 2019-01-05: qty 6.7

## 2019-01-05 NOTE — ED Provider Notes (Signed)
[facility]-EMERGENCY DEPT Provider Note   CSN: [id number] Arrival date & time: 01/04/19  2222     History Chief Complaint  Patient presents with  . Asthma    Jon Bowen is a 58 y.o. male.  Patient presents to the emergency department with a chief complaint of asthma.  He states that he has been needing to use his inhaler more frequently over the past few days.  He states that normally when this happens, he comes to the emergency department and gets a nebulizer treatment and sometimes steroids.  He denies any fevers, chills, productive cough.  Denies any other associated symptoms.  The history is provided by the patient. No language interpreter was used.       Past Medical History:  Diagnosis Date  . Asthma     Patient Active Problem List   Diagnosis Date Noted  . Finger injury 06/25/2014  . Atypical pneumonia 01/05/2014  . Asthma with acute exacerbation 12/26/2013  . Screening for prostate cancer 07/04/2013  . Colon cancer screening 07/04/2013  . Right knee pain 08/23/2012  . Right tennis elbow 12/02/2010  . 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 Gastrocnemius muscle tear 10/21/2010  . Ankle pain, left 08/06/2010  . Cough 07/29/2010  . Conjunctivitis of both eyes 03/31/2010  . HYPERTRIGLYCERIDEMIA 12/20/2009  . IMPAIRED FASTING GLUCOSE 12/20/2009  . DEGENERATIVE JOINT DISEASE, RIGHT KNEE 10/31/2007  . DERMATOPHYTOSIS OF FOOT 09/12/2007  . ALLERGIC RHINITIS 09/12/2007  . Asthma, moderate persistent, well-controlled 09/12/2007    History reviewed. No pertinent surgical history.     No family history on file.  Social History   Tobacco Use  . Smoking status: Never Smoker  . Smokeless tobacco: Never Used  Substance Use Topics  . Alcohol use: Yes    Comment: occasionally  . Drug use: No    Home Medications Prior to Admission medications   Medication Sig Start Date End Date Taking? Authorizing Provider  acetaminophen (TYLENOL)  500 MG tablet Take 1 tablet (500 mg total) by mouth every 6 (six) hours as needed. 02/14/14   Narda BondsNettey, Ralph A, MD  albuterol (PROVENTIL HFA;VENTOLIN HFA) 108 (90 Base) MCG/ACT inhaler Inhale 1-2 puffs into the lungs every 6 (six) hours as needed for wheezing or shortness of breath. 02/24/18   Arnaldo Nataliamond, Michael S, MD  cetirizine (ZYRTEC ALLERGY) 10 MG tablet Take 1 tablet (10 mg total) by mouth daily. Patient not taking: Reported on 02/24/2018 04/29/17   Emi HolesLaw, Alexandra M, PA-C  fluticasone (FLOVENT HFA) 110 MCG/ACT inhaler Inhale 2 puffs into the lungs 2 (two) times daily. Patient not taking: Reported on 02/24/2018 10/30/17   Ofilia Neaslark, Michael L, PA-C  Fluticasone-Salmeterol (ADVAIR DISKUS) 250-50 MCG/DOSE AEPB Inhale 1 puff into the lungs 2 (two) times daily. 02/24/18   Arnaldo Nataliamond, Michael S, MD  montelukast (SINGULAIR) 10 MG tablet Take 1 tablet (10 mg total) by mouth at bedtime. Patient not taking: Reported on 02/24/2018 10/30/17   Ofilia Neaslark, Michael L, PA-C  predniSONE (DELTASONE) 50 MG tablet Take 1 tablet (50 mg total) by mouth daily. Patient not taking: Reported on 02/24/2018 02/15/18   Mardella LaymanHagler, Brian, MD    Allergies    Patient has no known allergies.  Review of Systems   Review of Systems  All other systems reviewed and are negative.   Physical Exam Updated Vital Signs BP (!) 152/94 (BP Location: Left Arm)   Pulse 86   Temp 98 F (36.7 C) (Oral)   Resp 16   Ht 5\' 3"  (  1.6 m)   Wt 81.6 kg   SpO2 95%   BMI 31.89 kg/m   Physical Exam Vitals and nursing note reviewed.  Constitutional:      Appearance: He is well-developed.  HENT:     Head: Normocephalic and atraumatic.  Eyes:     Conjunctiva/sclera: Conjunctivae normal.  Cardiovascular:     Rate and Rhythm: Normal rate and regular rhythm.     Heart sounds: No murmur.  Pulmonary:     Effort: Pulmonary effort is normal. No respiratory distress.     Breath sounds: Normal breath sounds.     Comments: Faint wheezes bilaterally Abdominal:      Palpations: Abdomen is soft.     Tenderness: There is no abdominal tenderness.  Musculoskeletal:     Cervical back: Neck supple.  Skin:    General: Skin is warm and dry.  Neurological:     Mental Status: He is alert and oriented to person, place, and time.  Psychiatric:        Mood and Affect: Mood normal.        Behavior: Behavior normal.     ED Results / Procedures / Treatments   Labs (all labs ordered are listed, but only abnormal results are displayed) Labs Reviewed - No data to display  EKG None  Radiology DG Chest 2 View  Result Date: 01/04/2019 CLINICAL DATA:  Shortness of breath for 2 weeks, history of inhaler EXAM: CHEST - 2 VIEW COMPARISON:  Most recent radiograph 04/28/2017 FINDINGS: Scattered bilateral pulmonary parenchymal calcifications compatible with remote granulomatous disease seen on numerous prior comparison. No consolidation, features of edema, pneumothorax, or effusion. The cardiomediastinal contours are unremarkable. No acute osseous or soft tissue abnormality. Degenerative changes are present in the imaged spine and shoulders. IMPRESSION: 1.  No acute cardiopulmonary abnormality. 2. Scattered pulmonary parenchymal calcifications compatible with remote granulomatous disease seen on numerous prior comparison. Electronically Signed   By: Kreg Shropshire M.D.   On: 01/04/2019 23:07    Procedures Procedures (including critical care time)  Medications Ordered in ED Medications  predniSONE (DELTASONE) tablet 60 mg (has no administration in time range)  albuterol (VENTOLIN HFA) 108 (90 Base) MCG/ACT inhaler 8 puff (has no administration in time range)  AeroChamber Plus Flo-Vu Large MISC 1 each (has no administration in time range)    ED Course  I have reviewed the triage vital signs and the nursing notes.  Pertinent labs & imaging results that were available during my care of the patient were reviewed by me and considered in my medical decision making (see chart  for details).    MDM Rules/Calculators/A&P                      Patient with mild asthma exacerbation.  Will give 60 mg oral prednisone here.  Will give 8 puffs of albuterol.  Plan for discharge with 5-day course of prednisone.  Will give patient an additional inhaler.  Recommend close follow-up.  He is not hypoxic and in no acute distress.  I find him stable for discharge.   Final Clinical Impression(s) / ED Diagnoses Final diagnoses:  Mild asthma with exacerbation, unspecified whether persistent    Rx / DC Orders ED Discharge Orders         Ordered    predniSONE (DELTASONE) 20 MG tablet  Daily     01/05/19 0045           Roxy Horseman, PA-C 01/05/19 [phone]  Mesner, Corene Cornea, MD 01/05/19 [phone]

## 2019-01-27 ENCOUNTER — Other Ambulatory Visit: Payer: Self-pay

## 2019-01-27 ENCOUNTER — Encounter (HOSPITAL_COMMUNITY): Payer: Self-pay | Admitting: Family Medicine

## 2019-01-27 ENCOUNTER — Ambulatory Visit (HOSPITAL_COMMUNITY)
Admission: EM | Admit: 2019-01-27 | Discharge: 2019-01-27 | Disposition: A | Discharge status: Home / Self Care | Payer: 59 | Attending: Family Medicine | Admitting: Family Medicine

## 2019-01-27 DIAGNOSIS — J4541 Moderate persistent asthma with (acute) exacerbation: Secondary | ICD-10-CM | POA: Diagnosis not present

## 2019-01-27 MED ORDER — PREDNISONE 50 MG PO TABS: ORAL_TABLET | ORAL | 2 refills | Status: AC

## 2019-01-27 MED ORDER — BUDESONIDE-FORMOTEROL FUMARATE 160-4.5 MCG/ACT IN AERO: 2.0000 | INHALATION_SPRAY | Freq: Two times a day (BID) | RESPIRATORY_TRACT | 12 refills | Status: AC

## 2019-01-27 NOTE — ED Triage Notes (Addendum)
Pt. States he has had trouble breathing since last night, he has been using his albuterol. Tested POSITIVE for COVID 10/24/2018.

## 2019-01-27 NOTE — Discharge Instructions (Addendum)
Strategies to prevent and/or treat COVID-19 and asthma:  Vitamin D3 5000 IU (125 mcg) daily Vitamin C 500 mg twice daily

## 2019-01-27 NOTE — ED Provider Notes (Addendum)
[facility]    CSN: [id number] Arrival date & time: 01/27/19  1524      History   Chief Complaint Chief Complaint  Patient presents with  . Asthmatic    HPI Jon Bowen is a 59 y.o. male.   This a 59 year old established [facility] urgent care patient who is an asthmatic and is having difficulty breathing.  This is been a recurring problem in the wintertime especially.  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  He has been using his inhaler as directed but is not sufficient for stopping the wheezing.  Symptoms always seem to exacerbate in the winter and his final summer.  He is found that Symbicort is very effective and would like a refill on that as well.  Patient's had no fever or loss of sense of smell or diarrhea.  Note from 01/05/2019: Patient presents to the emergency department with a chief complaint of asthma.  He states that he has been needing to use his inhaler more frequently over the past few days.  He states that normally when this happens, he comes to the emergency department and gets a nebulizer treatment and sometimes steroids.  He denies any fevers, chills, productive cough.  Denies any other associated symptoms.     Past Medical History:  Diagnosis Date  . Asthma     Patient Active Problem List   Diagnosis Date Noted  . Finger injury 06/25/2014  . Atypical pneumonia 01/05/2014  . Asthma with acute exacerbation 12/26/2013  . Screening for prostate cancer 07/04/2013  . Colon cancer screening 07/04/2013  . Right knee pain 08/23/2012  . Right tennis elbow 12/02/2010  . 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 Gastrocnemius muscle tear 10/21/2010  . Ankle pain, left 08/06/2010  . Cough 07/29/2010  . Conjunctivitis of both eyes 03/31/2010  . HYPERTRIGLYCERIDEMIA 12/20/2009  . IMPAIRED FASTING GLUCOSE 12/20/2009  . DEGENERATIVE JOINT DISEASE, RIGHT KNEE 10/31/2007  . DERMATOPHYTOSIS OF FOOT 09/12/2007  . ALLERGIC RHINITIS  09/12/2007  . Asthma, moderate persistent, well-controlled 09/12/2007    History reviewed. No pertinent surgical history.     Home Medications    Prior to Admission medications   Medication Sig Start Date End Date Taking? Authorizing Provider  albuterol (PROVENTIL HFA;VENTOLIN HFA) 108 (90 Base) MCG/ACT inhaler Inhale 1-2 puffs into the lungs every 6 (six) hours as needed for wheezing or shortness of breath. 02/24/18   Arnaldo Natal, MD  budesonide-formoterol [facility]) 160-4.5 MCG/ACT inhaler Inhale 2 puffs into the lungs 2 (two) times daily. 01/27/19   Elvina Sidle, MD  predniSONE (DELTASONE) 50 MG tablet Neomia Dear pastilla al dia 01/27/19   Elvina Sidle, MD  cetirizine (ZYRTEC ALLERGY) 10 MG tablet Take 1 tablet (10 mg total) by mouth daily. Patient not taking: Reported on 02/24/2018 04/29/17 01/27/19  Emi Holes, PA-C  fluticasone (FLOVENT HFA) 110 MCG/ACT inhaler Inhale 2 puffs into the lungs 2 (two) times daily. Patient not taking: Reported on 02/24/2018 10/30/17 01/27/19  Ofilia Neas, PA-C  Fluticasone-Salmeterol (ADVAIR DISKUS) 250-50 MCG/DOSE AEPB Inhale 1 puff into the lungs 2 (two) times daily. 02/24/18 01/27/19  Arnaldo Natal, MD  montelukast (SINGULAIR) 10 MG tablet Take 1 tablet (10 mg total) by mouth at bedtime. Patient not taking: Reported on 02/24/2018 10/30/17 01/27/19  Ofilia Neas, PA-C    Family History Family History  Problem Relation Age of Onset  . Healthy Mother   . Healthy Father     Social History Social History  Tobacco Use  . Smoking status: Never Smoker  . Smokeless tobacco: Never Used  Substance Use Topics  . Alcohol use: Yes    Comment: occasionally  . Drug use: No     Allergies   Patient has no known allergies.   Review of Systems Review of Systems  Respiratory: Positive for cough and wheezing.   All other systems reviewed and are negative.    Physical Exam Triage Vital Signs ED Triage Vitals  Enc Vitals Group     BP        Pulse      Resp      Temp      Temp src      SpO2      Weight      Height      Head Circumference      Peak Flow      Pain Score      Pain Loc      Pain Edu?      Excl. in GC?    No data found.  Updated Vital Signs BP 134/76 (BP Location: Left Arm)   Pulse 82   Temp 98.1 F (36.7 C) (Oral)   Resp 18   Wt 83.2 kg   SpO2 97%   BMI 32.49 kg/m   Physical Exam Vitals and nursing note reviewed.  Constitutional:      Appearance: Normal appearance.  HENT:     Head: Normocephalic.  Eyes:     Conjunctiva/sclera: Conjunctivae normal.  Cardiovascular:     Rate and Rhythm: Normal rate.  Pulmonary:     Effort: Pulmonary effort is normal.     Breath sounds: Wheezing present.  Musculoskeletal:        General: Normal range of motion.     Cervical back: Normal range of motion and neck supple.  Skin:    General: Skin is warm and dry.  Neurological:     General: No focal deficit present.     Mental Status: He is alert.  Psychiatric:        Mood and Affect: Mood normal.        Behavior: Behavior normal.        Thought Content: Thought content normal.        Judgment: Judgment normal.      UC Treatments / Results  Labs (all labs ordered are listed, but only abnormal results are displayed) Labs Reviewed - No data to display  EKG   Radiology No results found.  Procedures Procedures (including critical care time)  Medications Ordered in UC Medications - No data to display  Initial Impression / Assessment and Plan / UC Course  I have reviewed the triage vital signs and the nursing notes.  Pertinent labs & imaging results that were available during my care of the patient were reviewed by me and considered in my medical decision making (see chart for details).    Final Clinical Impressions(s) / UC Diagnoses   Final diagnoses:  Moderate persistent asthma with acute exacerbation     Discharge Instructions     Strategies to prevent and/or treat COVID-19  and asthma:  Vitamin D3 5000 IU (125 mcg) daily Vitamin C 500 mg twice daily        ED Prescriptions    Medication Sig Dispense Auth. Provider   predniSONE (DELTASONE) 50 MG tablet Una pastilla al dia 5 tablet Elvina Sidle, MD   budesonide-formoterol [facility]) 160-4.5 MCG/ACT inhaler Inhale 2 puffs into the lungs 2 (  two) times daily. 1 Inhaler Robyn Haber, MD     I have reviewed the PDMP during this encounter.   Robyn Haber, MD 01/27/19 1616    Robyn Haber, MD 01/27/19 [phone]

## 2020-03-05 IMAGING — DX DG CHEST 2V
2 series · 2 of 2 positions shown · non-contrast
Comparison: 02/09/2016 chest radiograph

CLINICAL DATA: 56 y/o M; shortness of breath with dry cough and
some mid chest pain for 2-3 days. History of asthma.

EXAM:
CHEST - 2 VIEW

[chest pa]
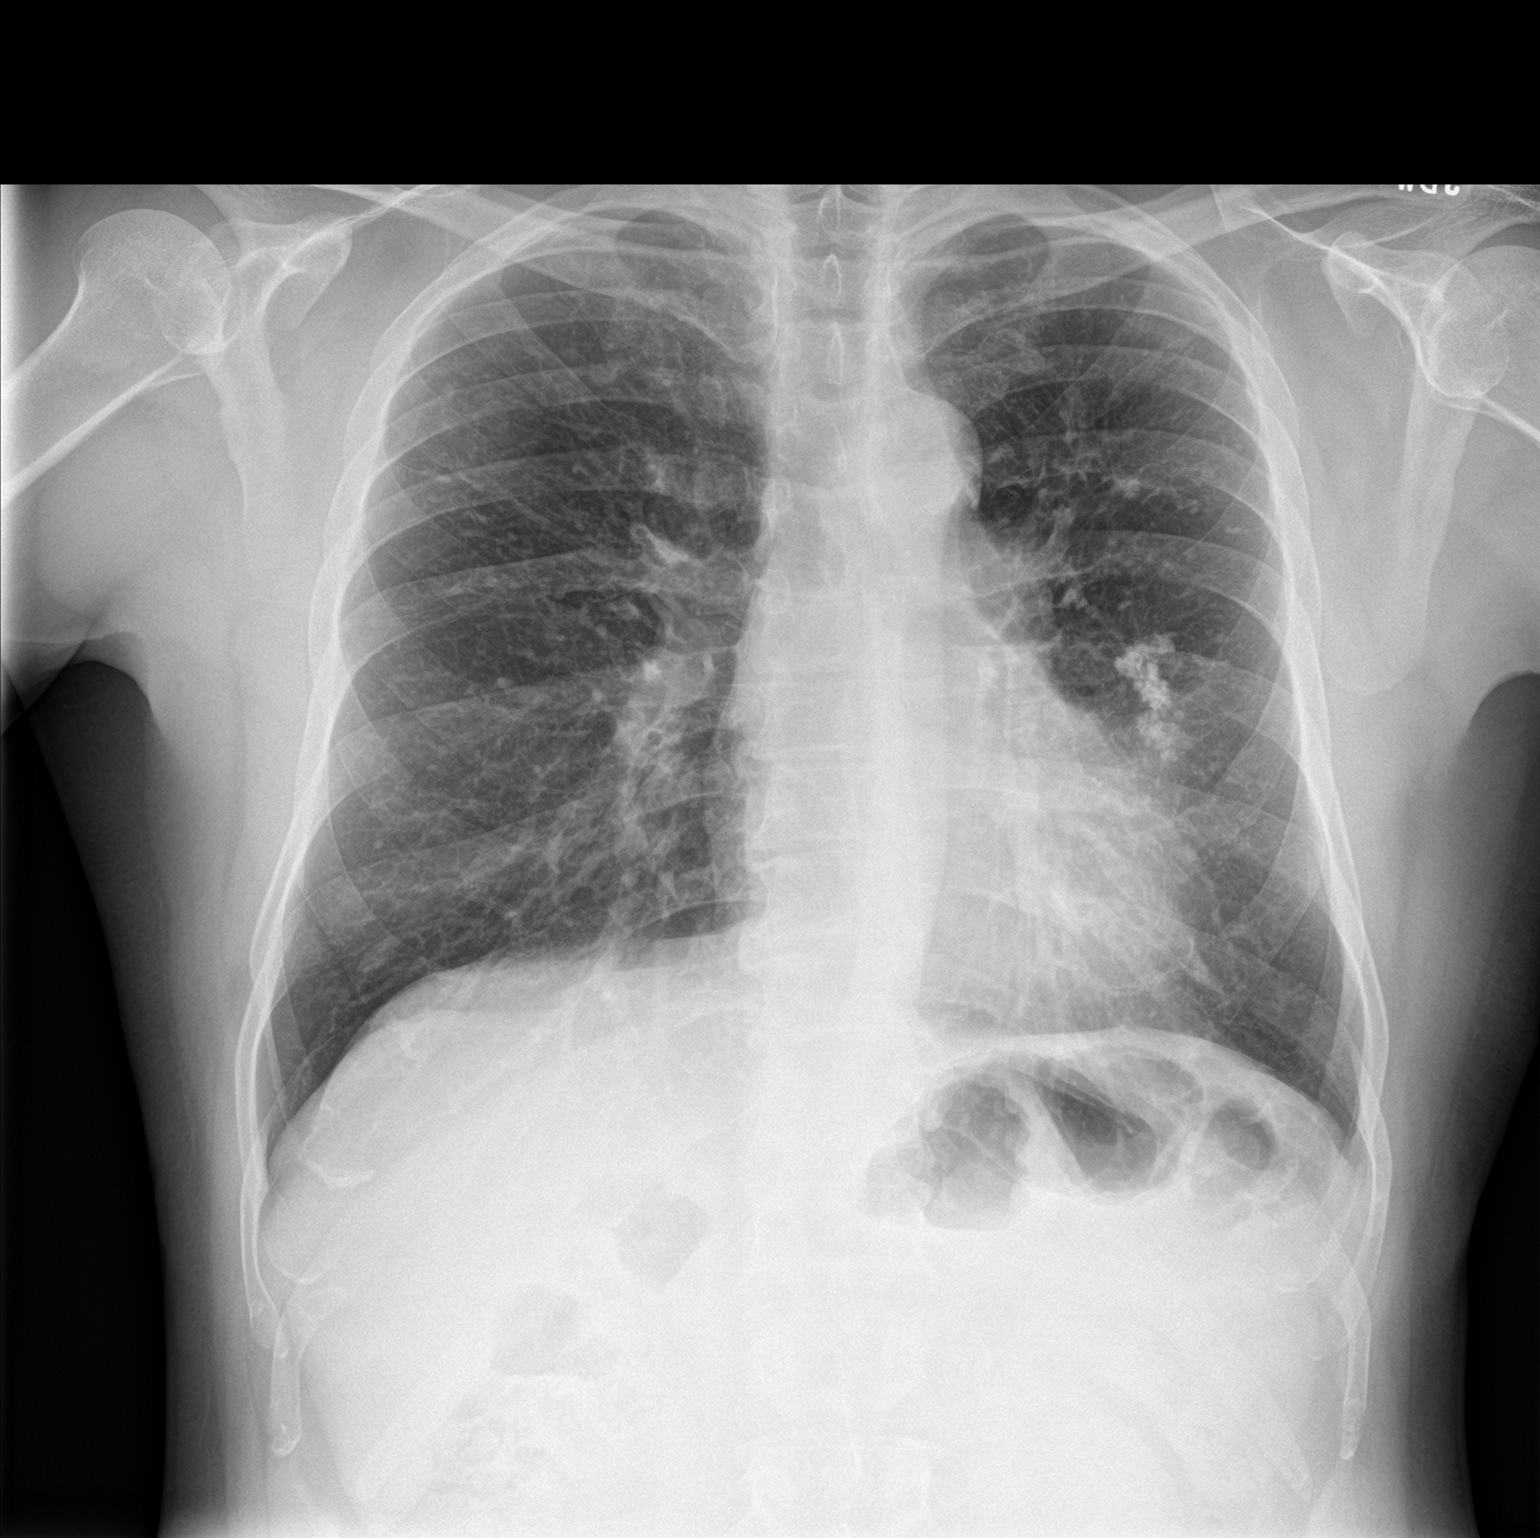

[chest lat]
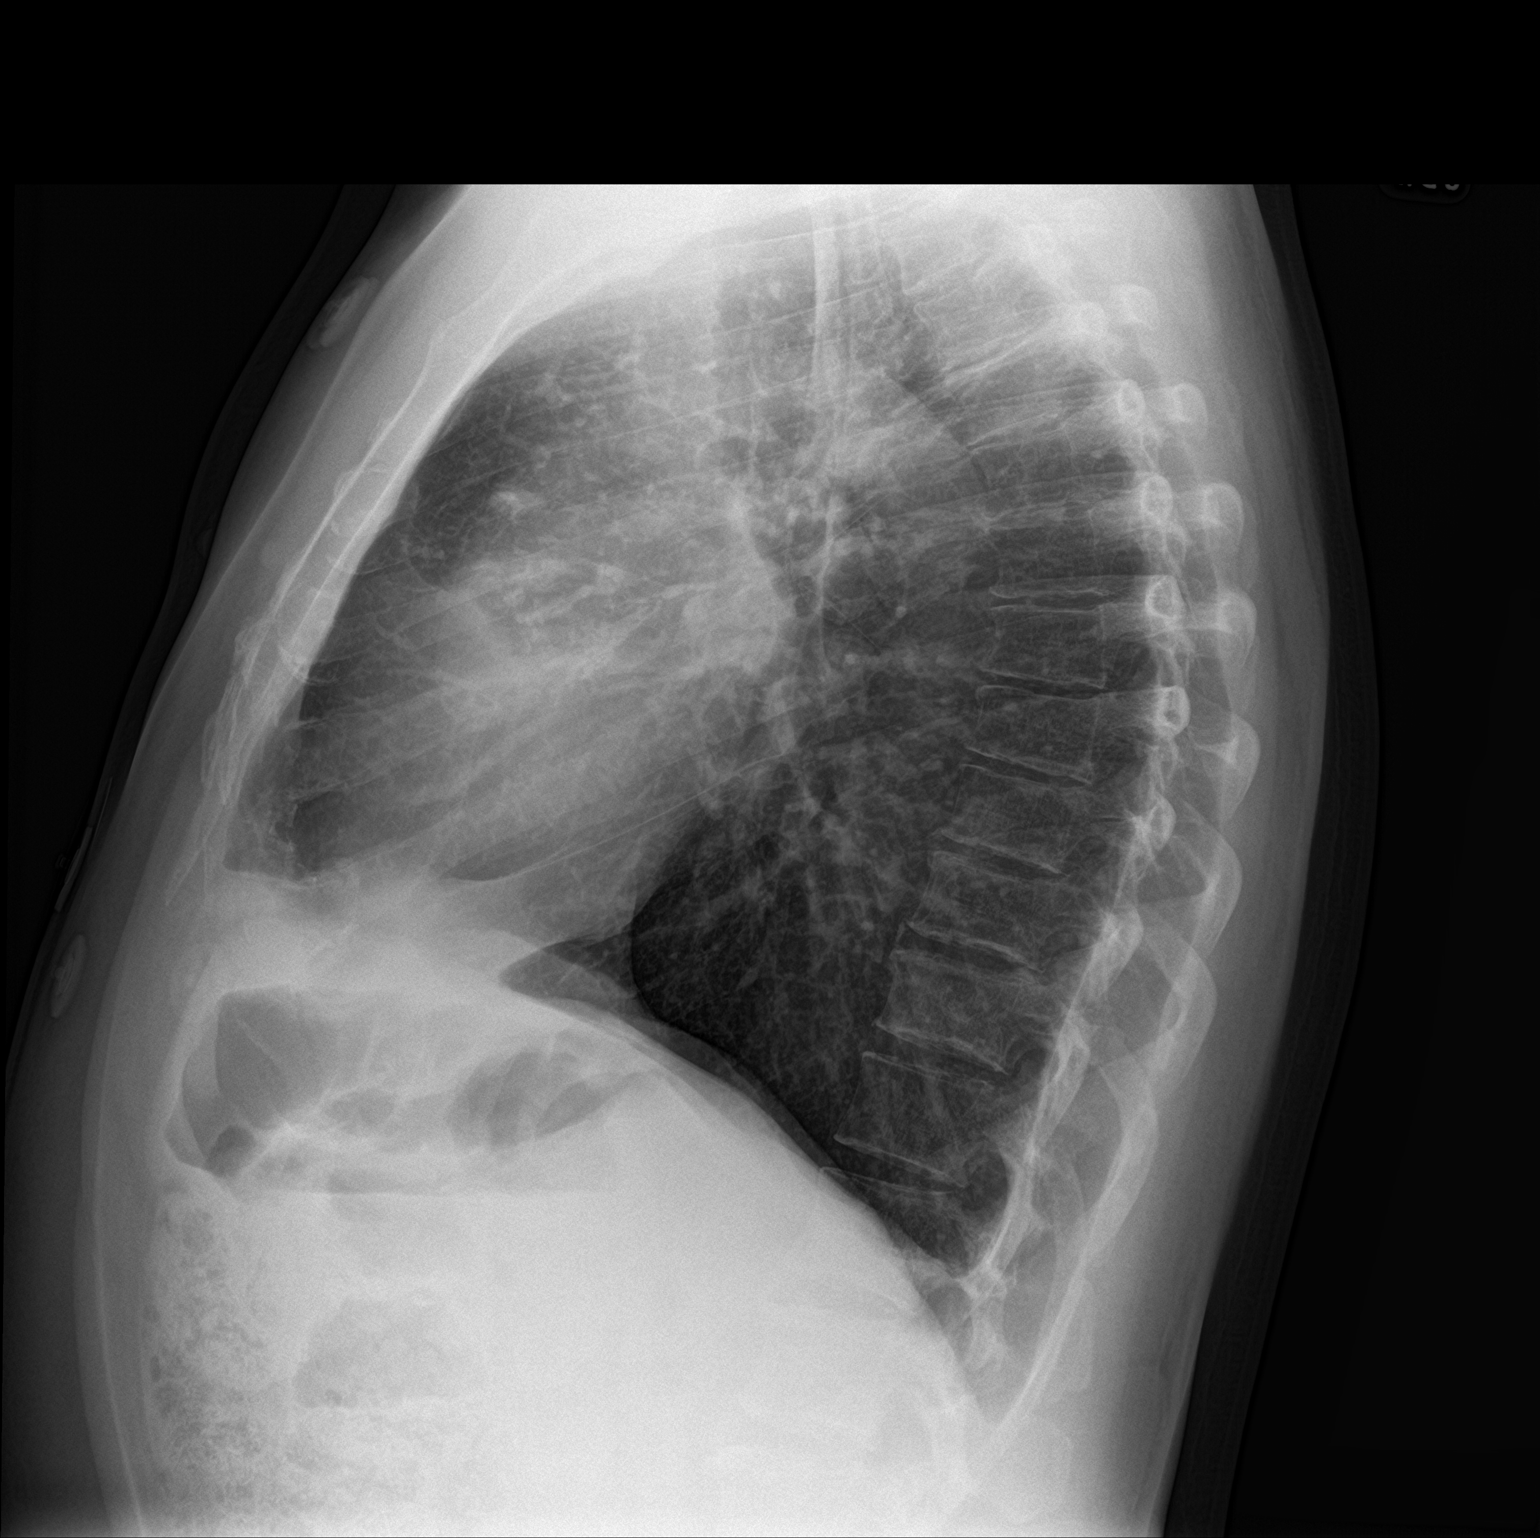

[2 of 2 positions shown; findings below may reference images not displayed]

FINDINGS: Stable normal cardiac silhouette given projection and technique.
Stable findings of chronic granulomatous disease. No new
consolidation, effusion, or pneumothorax. Bones are unremarkable.
IMPRESSION: Stable chronic findings of granulomatous disease. No acute pulmonary
process identified.

By: Dipika Wulff M.D.

## 2021-11-11 IMAGING — CR DG CHEST 2V
2 series · 2 of 2 positions shown · non-contrast
Comparison: Most recent radiograph 04/28/2017

CLINICAL DATA: Shortness of breath for 2 weeks, history of inhaler

EXAM:
CHEST - 2 VIEW

[w chest pa]
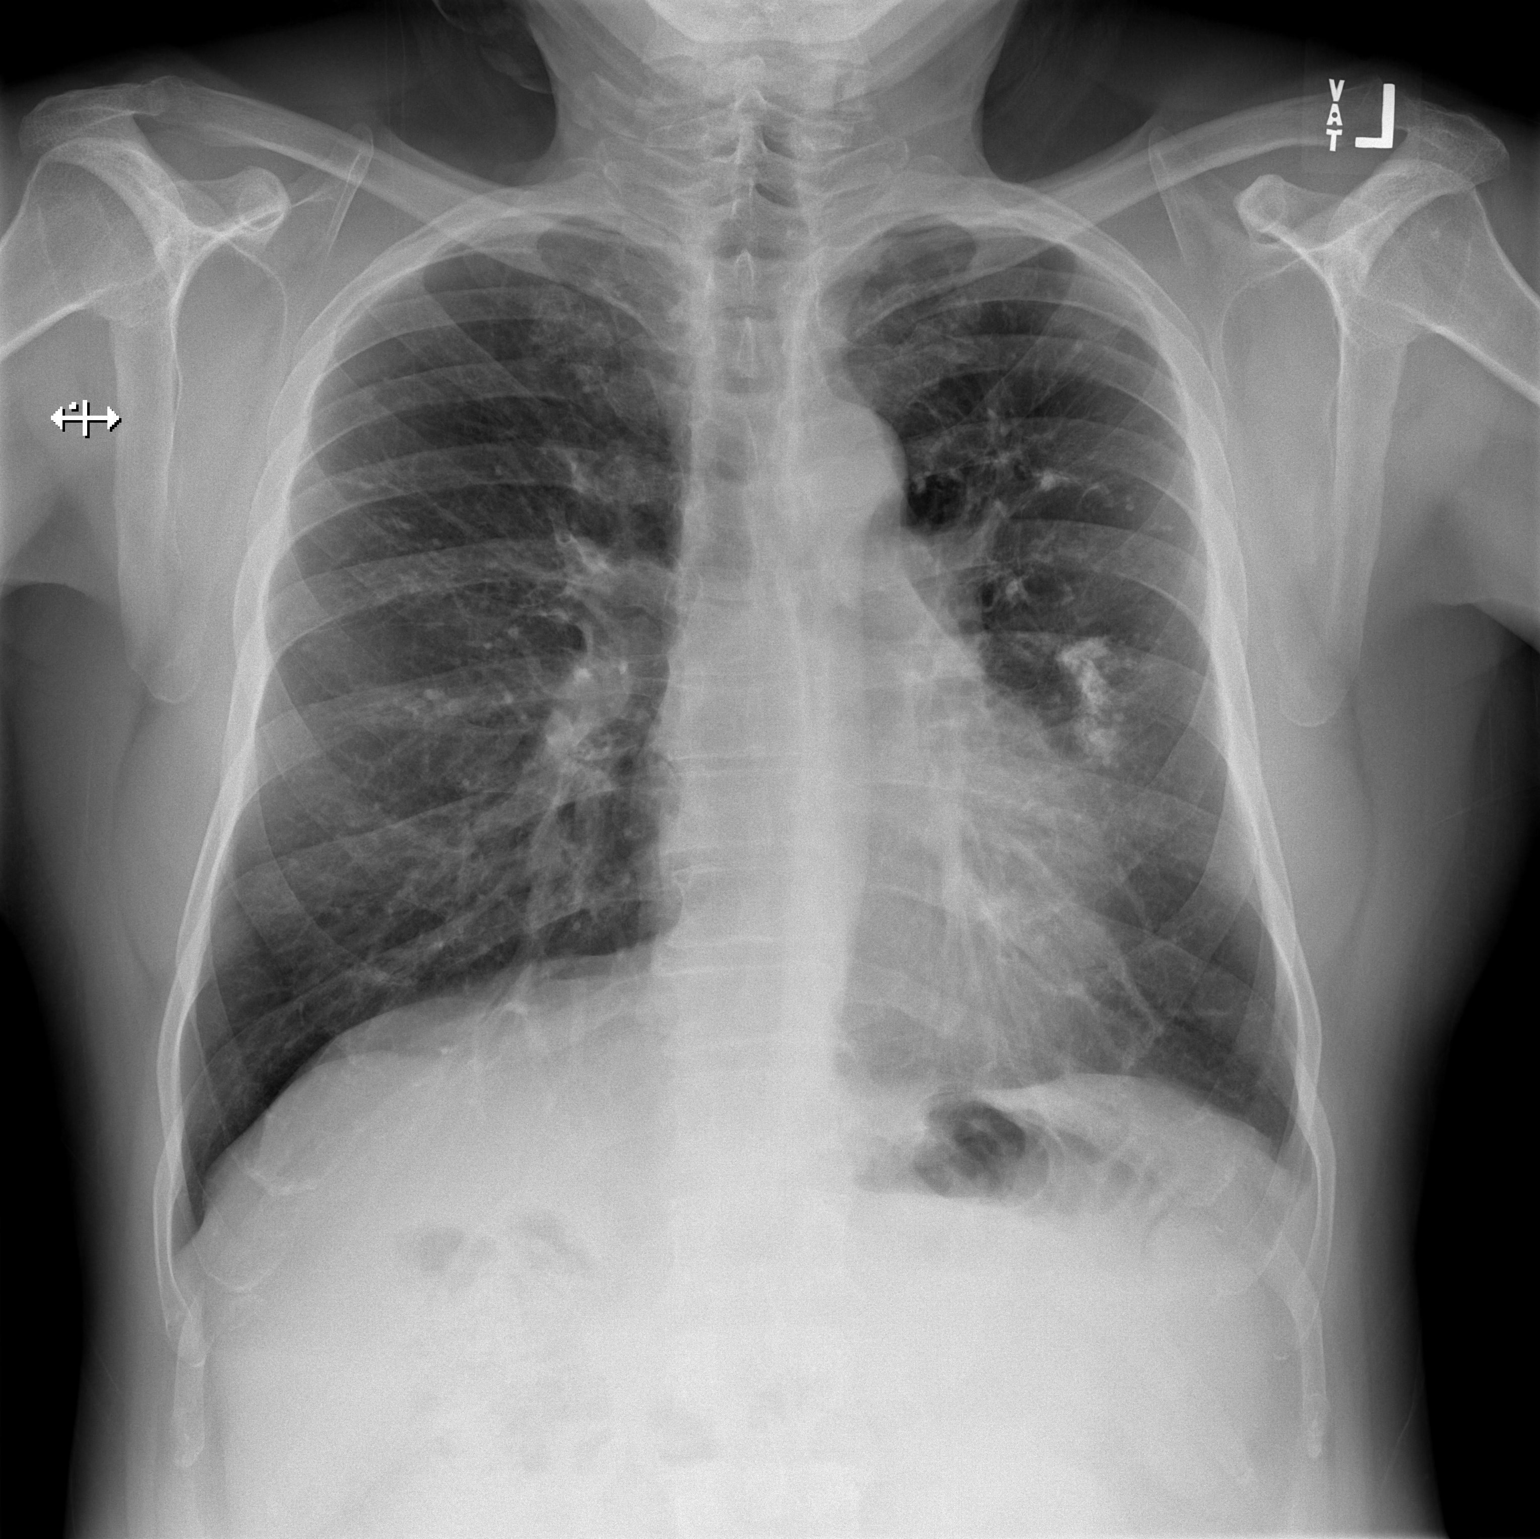

[w chest lat]
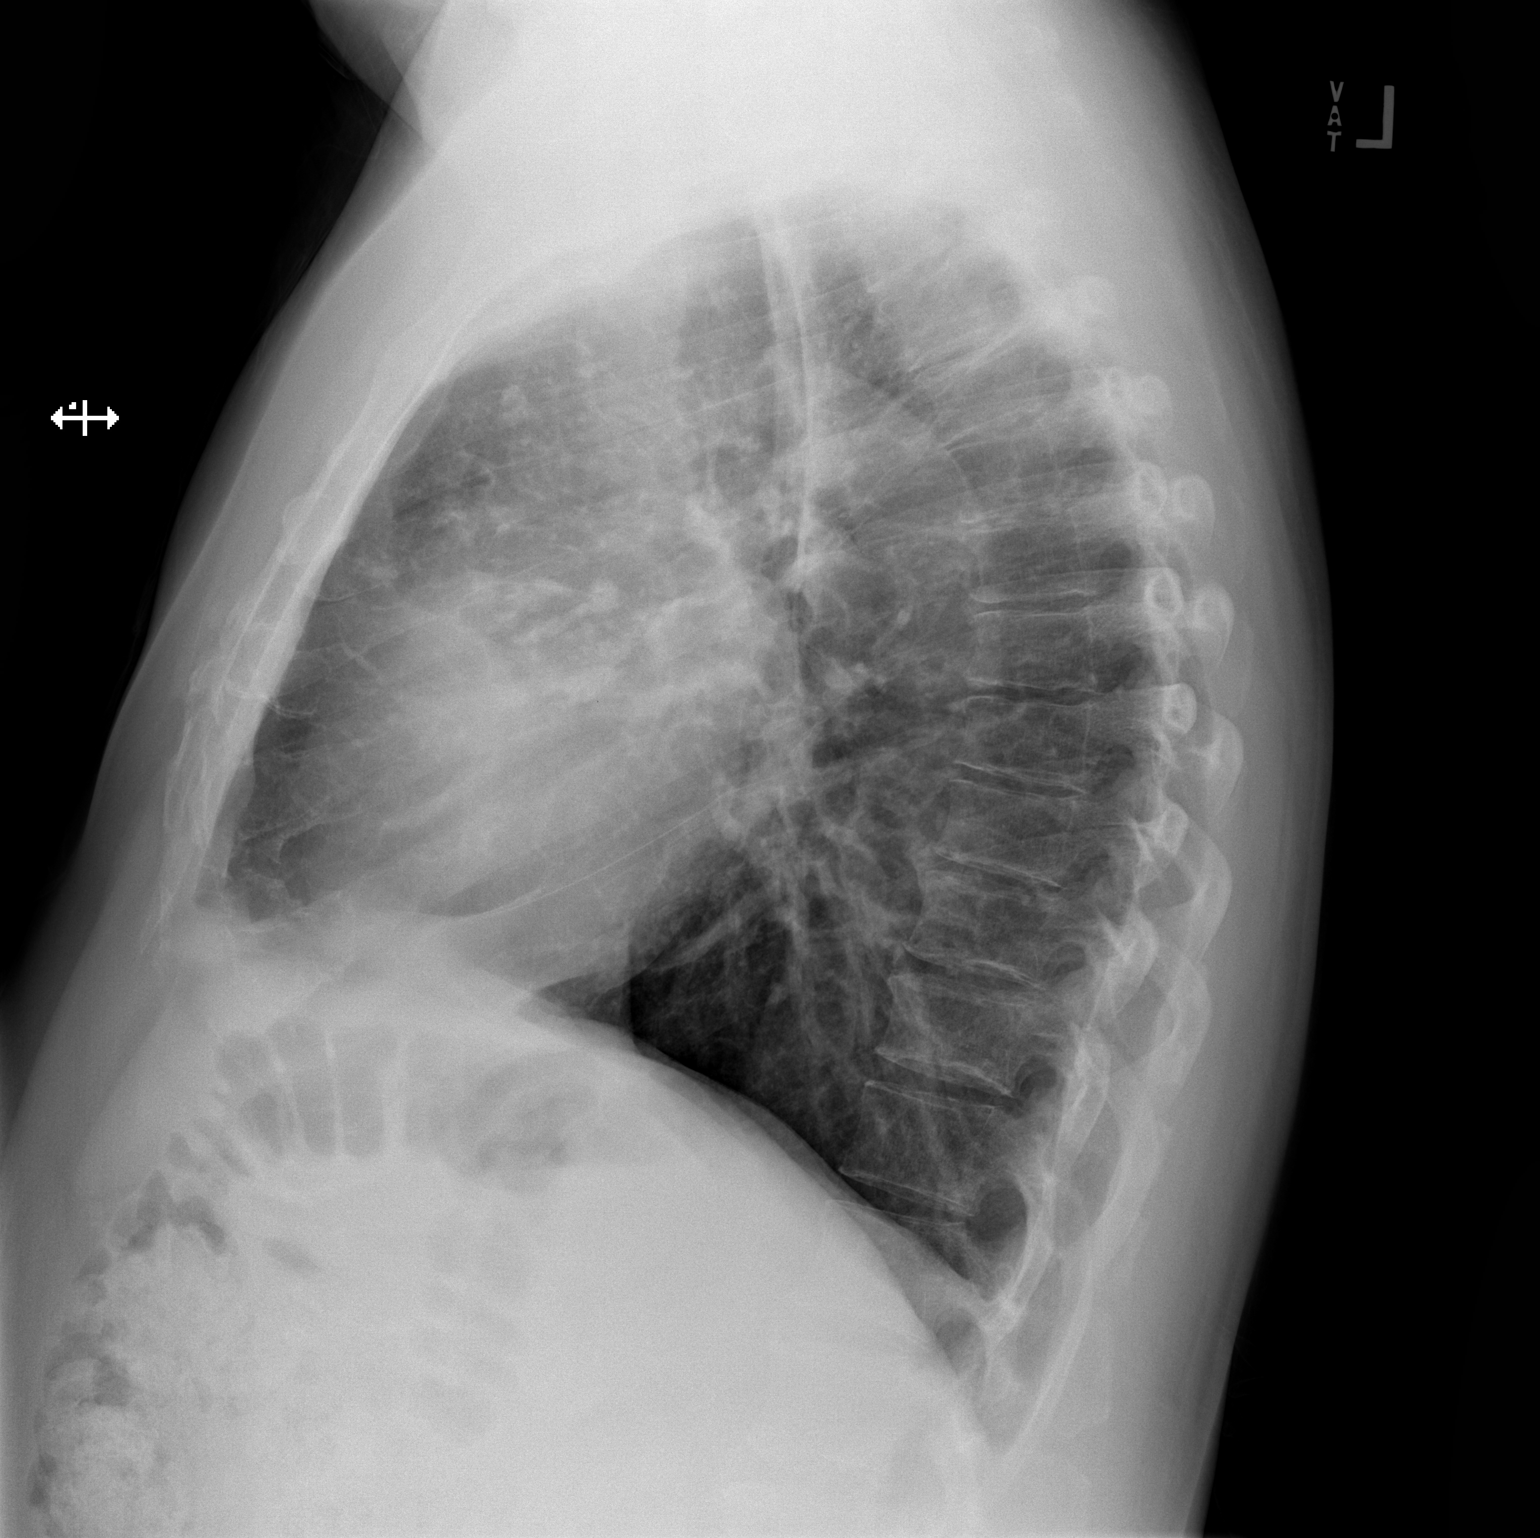

[2 of 2 positions shown; findings below may reference images not displayed]

FINDINGS: Scattered bilateral pulmonary parenchymal calcifications compatible
with remote granulomatous disease seen on numerous prior comparison.
No consolidation, features of edema, pneumothorax, or effusion. The
cardiomediastinal contours are unremarkable. No acute osseous or
soft tissue abnormality. Degenerative changes are present in the
imaged spine and shoulders.
IMPRESSION: 1.  No acute cardiopulmonary abnormality.
2. Scattered pulmonary parenchymal calcifications compatible with
remote granulomatous disease seen on numerous prior comparison.

## 2023-11-10 ENCOUNTER — Emergency Department (HOSPITAL_COMMUNITY): Payer: Self-pay

## 2023-11-10 ENCOUNTER — Emergency Department (HOSPITAL_COMMUNITY)
Admission: EM | Admit: 2023-11-10 | Discharge: 2023-11-11 | Disposition: A | Discharge status: Home / Self Care | Payer: Self-pay | Attending: Emergency Medicine | Admitting: Emergency Medicine

## 2023-11-10 ENCOUNTER — Encounter (HOSPITAL_COMMUNITY): Payer: Self-pay | Admitting: Emergency Medicine

## 2023-11-10 ENCOUNTER — Other Ambulatory Visit: Payer: Self-pay

## 2023-11-10 DIAGNOSIS — J45909 Unspecified asthma, uncomplicated: Secondary | ICD-10-CM | POA: Insufficient documentation

## 2023-11-10 DIAGNOSIS — R0602 Shortness of breath: Secondary | ICD-10-CM | POA: Insufficient documentation

## 2023-11-10 DIAGNOSIS — E876 Hypokalemia: Secondary | ICD-10-CM | POA: Insufficient documentation

## 2023-11-10 LAB — CBC
HCT: 42.9 % (ref 39.0–52.0)
Hemoglobin: 14.1 g/dL (ref 13.0–17.0)
MCH: 30.3 pg (ref 26.0–34.0)
MCHC: 32.9 g/dL (ref 30.0–36.0)
MCV: 92.1 fL (ref 80.0–100.0)
Platelets: 210 K/uL (ref 150–400)
RBC: 4.66 MIL/uL (ref 4.22–5.81)
RDW: 12.5 % (ref 11.5–15.5)
WBC: 8.3 K/uL (ref 4.0–10.5)
nRBC: 0 % (ref 0.0–0.2)

## 2023-11-10 NOTE — ED Triage Notes (Signed)
 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 Patient report taking inhaler with no relief. Patient denies chest pain. Patient denies N/V.

## 2023-11-11 LAB — BASIC METABOLIC PANEL WITH GFR
Anion gap: 10 (ref 5–15)
BUN: 14 mg/dL (ref 8–23)
CO2: 26 mmol/L (ref 22–32)
Calcium: 9.4 mg/dL (ref 8.9–10.3)
Chloride: 102 mmol/L (ref 98–111)
Creatinine, Ser: 0.87 mg/dL (ref 0.61–1.24)
GFR, Estimated: >60 mL/min (ref >60)
Glucose, Bld: 143 mg/dL — ABNORMAL HIGH (ref 70–99)
Potassium: 3.3 mmol/L — ABNORMAL LOW (ref 3.5–5.1)
Sodium: 139 mmol/L (ref 135–145)

## 2023-11-11 MED ORDER — POTASSIUM CHLORIDE CRYS ER 20 MEQ PO TBCR: 40.0000 meq | EXTENDED_RELEASE_TABLET | Freq: Once | ORAL | Status: DC

## 2023-11-11 MED ORDER — ALBUTEROL SULFATE HFA 108 (90 BASE) MCG/ACT IN AERS
2.0000 | INHALATION_SPRAY | Freq: Once | RESPIRATORY_TRACT | Status: AC
  Administered 2023-11-11: 2 via RESPIRATORY_TRACT
  Filled 2023-11-11: qty 6.7

## 2023-11-11 NOTE — ED Notes (Signed)
 Patient d/c at this time with family at bedside. IV discontinued. Vitals signs obtained.

## 2023-11-11 NOTE — ED Provider Notes (Signed)
 [place] EMERGENCY DEPARTMENT AT [facility] Provider Note   CSN: [id number] Arrival date & time: 11/10/23  2323     Patient presents with: Shortness of Breath   Jon Bowen is a 63 y.o. male.   63 year old male with past medical history of asthma presents with complaint of shortness of breath.  Patient states that he was cutting carpet earlier tonight and then developed difficulty breathing.  Patient used his albuterol  inhaler.  His symptoms have since resolved.  He denies fevers.  He is a non-smoker.  He is almost out of his albuterol  inhaler.  No other complaints or concerns today.  He states his asthma is generally well-controlled although reports typically flares in the winter.       Prior to Admission medications   Medication Sig Start Date End Date Taking? Authorizing Provider  albuterol  (PROVENTIL  HFA;VENTOLIN  HFA) 108 (90 Base) MCG/ACT inhaler Inhale 1-2 puffs into the lungs every 6 (six) hours as needed for wheezing or shortness of breath. 02/24/18   Stephania Ozell RAMAN, MD  budesonide -formoterol  (SYMBICORT ) 160-4.5 MCG/ACT inhaler Inhale 2 puffs into the lungs 2 (two) times daily. 01/27/19   Mario Million, MD  predniSONE  (DELTASONE ) 50 MG tablet Ignacia pastilla al dia 01/27/19   Mario Million, MD  cetirizine  (ZYRTEC  ALLERGY) 10 MG tablet Take 1 tablet (10 mg total) by mouth daily. Patient not taking: Reported on 02/24/2018 04/29/17 01/27/19  Law, Alexandra M, PA-C  fluticasone  (FLOVENT  HFA) 110 MCG/ACT inhaler Inhale 2 puffs into the lungs 2 (two) times daily. Patient not taking: Reported on 02/24/2018 10/30/17 01/27/19  Gretta Ozell CROME, PA-C  Fluticasone -Salmeterol (ADVAIR DISKUS) 250-50 MCG/DOSE AEPB Inhale 1 puff into the lungs 2 (two) times daily. 02/24/18 01/27/19  Stephania Ozell RAMAN, MD  montelukast  (SINGULAIR ) 10 MG tablet Take 1 tablet (10 mg total) by mouth at bedtime. Patient not taking: Reported on 02/24/2018 10/30/17 01/27/19  Gretta Ozell CROME, PA-C     Allergies: Patient has no known allergies.    Review of Systems Negative except as per HPI Updated Vital Signs BP (!) 155/84   Pulse 85   Temp 98.2 F (36.8 C) (Oral)   Resp 17   SpO2 96%   Physical Exam Vitals and nursing note reviewed.  Constitutional:      General: He is not in acute distress.    Appearance: He is well-developed. He is not diaphoretic.  HENT:     Head: Normocephalic and atraumatic.  Cardiovascular:     Rate and Rhythm: Normal rate and regular rhythm.  Pulmonary:     Effort: Pulmonary effort is normal.     Breath sounds: Normal breath sounds. No decreased breath sounds.  Skin:    General: Skin is warm and dry.  Neurological:     Mental Status: He is alert and oriented to person, place, and time.  Psychiatric:        Behavior: Behavior normal.     (all labs ordered are listed, but only abnormal results are displayed) Labs Reviewed  BASIC METABOLIC PANEL WITH GFR - Abnormal; Notable for the following components:      Result Value   Potassium 3.3 (*)    Glucose, Bld 143 (*)    All other components within normal limits  CBC    EKG: None  Radiology: DG Chest 2 View Result Date: 11/10/2023 CLINICAL DATA:  Shortness of breath EXAM: CHEST - 2 VIEW COMPARISON:  01/04/2019 FINDINGS: Cardiac shadow is stable. Lungs are well aerated bilaterally. Scattered calcifications  are seen and stable from the prior exam consistent with prior granulomatous disease. No focal infiltrate is noted. No effusion is seen. Bony structures are within normal limits. IMPRESSION: Changes of prior granulomatous disease.  No acute abnormality noted. Electronically Signed   By: Oneil Devonshire M.D.   On: 11/10/2023 23:45     Procedures   Medications Ordered in the ED  potassium chloride SA (KLOR-CON M) CR tablet 40 mEq (has no administration in time range)  albuterol  (VENTOLIN  HFA) 108 (90 Base) MCG/ACT inhaler 2 puff (2 puffs Inhalation Given 11/11/23 0451)                                     Medical Decision Making Amount and/or Complexity of Data Reviewed Labs: ordered. Radiology: ordered.   63 year old male with history of asthma presents with shortness of breath after he was cutting carpet and felt he inhaled carpet fibers.  He used his inhaler and symptoms resolved.  He has no complaints at this time.  On exam, he is alert, in no distress.  His lungs are clear to auscultation all fields.  His vitals are reviewed and he is found to have O2 sat 96% on room air.  Chest x-ray as ordered inter myself is negative for acute process.  Radiology read of changes of prior granulomatous disease, stable.  He is found to have mild hypokalemia with potassium of 3.3, CBC normal. EKG with sinus rhythm, rate 77. He is provided with oral dose of potassium prior to discharge with a albuterol  inhaler. Referred to [facility] and wellness, no primary care on file.  Return to ER for worsening or concerning symptoms.     Final diagnoses:  Shortness of breath  Hypokalemia    ED Discharge Orders     None          Beverley Leita DELENA DEVONNA 11/11/23 0454    Griselda Norris, MD 11/11/23 1100

## 2023-11-11 NOTE — Discharge Instructions (Addendum)
 Use el inhalador segn sea necesario, de 1 a 2 inhalaciones cada 4 a 6 horas. Regrese a urgencias si presenta sntomas preocupantes o empeoramiento.  Consulte con su mdico de cabecera para una nueva evaluacin. Si no tiene un mdico de cabecera, llame a [facility].  Use inhaler as needed, 1 to 2 puffs every 4-6 hours. Return to the emergency room for worsening or concerning symptoms.  Follow-up with primary care provider for recheck.  Please call [place] and wellness if you do not have a primary care doctor.

## 2023-12-08 ENCOUNTER — Other Ambulatory Visit: Payer: Self-pay | Admitting: Internal Medicine

## 2023-12-08 DIAGNOSIS — B309 Viral conjunctivitis, unspecified: Secondary | ICD-10-CM | POA: Insufficient documentation

## 2023-12-08 MED ORDER — NEOMYCIN-POLYMYXIN-DEXAMETH 3.5-10000-0.1 OP SUSP: 2.0000 [drp] | Freq: Four times a day (QID) | OPHTHALMIC | 0 refills | Status: AC

## 2024-02-15 ENCOUNTER — Other Ambulatory Visit: Payer: Self-pay | Admitting: Internal Medicine

## 2024-02-15 DIAGNOSIS — B351 Tinea unguium: Secondary | ICD-10-CM | POA: Insufficient documentation

## 2024-02-17 ENCOUNTER — Other Ambulatory Visit: Payer: Self-pay | Admitting: Internal Medicine

## 2024-02-17 DIAGNOSIS — B351 Tinea unguium: Secondary | ICD-10-CM

## 2024-02-17 DIAGNOSIS — B353 Tinea pedis: Secondary | ICD-10-CM

## 2024-02-17 MED ORDER — CICLOPIROX 8 % EX SOLN: Freq: Every day | CUTANEOUS | 1 refills | Status: AC
